# Patient Record
Sex: Female | Born: 1990 | Race: White | Hispanic: No | Marital: Married | Smoking: Never smoker
Health system: Southern US, Community
[De-identification: ages and names within clinical notes are randomized; demographics above are authoritative.]

## PROBLEM LIST (undated history)

## (undated) DIAGNOSIS — T7840XA Allergy, unspecified, initial encounter: Secondary | ICD-10-CM

## (undated) DIAGNOSIS — F419 Anxiety disorder, unspecified: Secondary | ICD-10-CM

## (undated) DIAGNOSIS — Z789 Other specified health status: Secondary | ICD-10-CM

## (undated) DIAGNOSIS — F32A Depression, unspecified: Secondary | ICD-10-CM

## (undated) DIAGNOSIS — J45909 Unspecified asthma, uncomplicated: Secondary | ICD-10-CM

## (undated) DIAGNOSIS — K219 Gastro-esophageal reflux disease without esophagitis: Secondary | ICD-10-CM

## (undated) HISTORY — DX: Other specified health status: Z78.9

## (undated) HISTORY — PX: NO PAST SURGERIES: SHX2092

## (undated) HISTORY — DX: Unspecified asthma, uncomplicated: J45.909

## (undated) HISTORY — DX: Depression, unspecified: F32.A

## (undated) HISTORY — DX: Allergy, unspecified, initial encounter: T78.40XA

## (undated) HISTORY — DX: Anxiety disorder, unspecified: F41.9

## (undated) HISTORY — DX: Gastro-esophageal reflux disease without esophagitis: K21.9

---
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
## 2016-05-20 ENCOUNTER — Encounter: Payer: Self-pay | Admitting: Emergency Medicine

## 2016-05-20 ENCOUNTER — Emergency Department
Admission: EM | Admit: 2016-05-20 | Discharge: 2016-05-20 | Disposition: A | Discharge status: Home / Self Care | Payer: Self-pay | Attending: Emergency Medicine | Admitting: Emergency Medicine

## 2016-05-20 DIAGNOSIS — G5602 Carpal tunnel syndrome, left upper limb: Secondary | ICD-10-CM | POA: Insufficient documentation

## 2016-05-20 MED ORDER — MELOXICAM 15 MG PO TABS: 15.0000 mg | ORAL_TABLET | Freq: Every day | ORAL | 0 refills | Status: DC

## 2016-05-20 NOTE — ED Notes (Signed)
See triage note. States she was carrying in bags about 5 days ago  Noticed some numbness to left middle finger  Numbness has cont'd for the past 5 days.no deformity noted

## 2016-05-20 NOTE — ED Provider Notes (Signed)
[facility] Emergency Department Provider Note  ____________________________________________  Time seen: Approximately 3:21 PM  I have reviewed the triage vital signs and the nursing notes.   HISTORY  Chief Complaint Numbness    HPI Jaime Harvey is a 26 y.o. female who presents emergency department complaining of pain and numbness and tingling to the third digit of the left hand. Patient reports that over the past several months she has noticed some intermittent symptoms to the left hand. 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 She reports that 5 days ago pain and numbness became constant. She states that it is localized right middle digit. She denies any pain or numbness or tingling to any other digit. She has not tried any medications for this complaint. No other complaints at this time.   History reviewed. No pertinent past medical history.  There are no active problems to display for this patient.   History reviewed. No pertinent surgical history.  Prior to Admission medications   Medication Sig Start Date End Date Taking? Authorizing Provider  meloxicam (MOBIC) 15 MG tablet Take 1 tablet (15 mg total) by mouth daily. 05/20/16   Delorise RoyalsJonathan D November Sypher, PA-C    Allergies Patient has no known allergies.  History reviewed. No pertinent family history.  Social History Social History  Substance Use Topics  . Smoking status: Never Smoker  . Smokeless tobacco: Never Used  . Alcohol use No     Review of Systems  Constitutional: No fever/chills Cardiovascular: no chest pain. Respiratory: no cough. No SOB. Gastrointestinal: No abdominal pain.  No nausea, no vomiting.   Musculoskeletal: Positive for pain, numbness and tingling to the third digit left hand Skin: Negative for rash, abrasions, lacerations, ecchymosis. Neurological: Negative for headaches, focal weakness or numbness. 10-point ROS  otherwise negative.  ____________________________________________   PHYSICAL EXAM:  VITAL SIGNS: ED Triage Vitals [05/20/16 1504]  Enc Vitals Group     BP (!) 143/79     Pulse Rate 72     Resp 16     Temp 98.2 F (36.8 C)     Temp Source Oral     SpO2 100 %     Weight      Height      Head Circumference      Peak Flow      Pain Score 0     Pain Loc      Pain Edu?      Excl. in GC?      Constitutional: Alert and oriented. Well appearing and in no acute distress. Eyes: Conjunctivae are normal. PERRL. EOMI. Head: Atraumatic. Neck: No stridor.    Cardiovascular: Normal rate, regular rhythm. Normal S1 and S2.  Good peripheral circulation. Respiratory: Normal respiratory effort without tachypnea or retractions. Lungs CTAB. Good air entry to the bases with no decreased or absent breath sounds. Musculoskeletal: Full range of motion to all extremities. No gross deformities appreciated. No gross deformities or edema noted to left hand. Full range of motion to the left wrist, all digits left hand. Cap refill less than 2 seconds all digits. Sensation intact all 5 digits, however, slightly different sensation to the third digit of the left hand. Negative Phalen's, positive Tinel's. Examination of the elbow, shoulder, cervical spine are unremarkable. Neurologic:  Normal speech and language. No gross focal neurologic deficits are appreciated.  Skin:  Skin is warm, dry and intact. No rash noted. Psychiatric: Mood and affect  are normal. Speech and behavior are normal. Patient exhibits appropriate insight and judgement.   ____________________________________________   LABS (all labs ordered are listed, but only abnormal results are displayed)  Labs Reviewed - No data to display ____________________________________________  EKG   ____________________________________________  RADIOLOGY   No results  found.  ____________________________________________    PROCEDURES  Procedure(s) performed:    Procedures    Medications - No data to display   ____________________________________________   INITIAL IMPRESSION / ASSESSMENT AND PLAN / ED COURSE  Pertinent labs & imaging results that were available during my care of the patient were reviewed by me and considered in my medical decision making (see chart for details).  Review of the [place] CSRS was performed in accordance of the [facility] prior to dispensing any controlled drugs.     Patient's diagnosis is consistent with carpal tunnel syndrome to the left hand. Patient's symptoms of intermittent numbness and tingling, weakness to the left hand over the past several months with now positive Tinel's and numbness and tingling to the third digit is consistent with carpal tunnel syndrome.. Patient will be discharged home with prescriptions for anti-inflammatory. She is also given a Velcro wrist brace to be used at nighttime and when not using left hand.. Patient is to follow up with orthopedics as needed or otherwise directed. Patient is given ED precautions to return to the ED for any worsening or new symptoms.     ____________________________________________  FINAL CLINICAL IMPRESSION(S) / ED DIAGNOSES  Final diagnoses:  Carpal tunnel syndrome of left wrist      NEW MEDICATIONS STARTED DURING THIS VISIT:  New Prescriptions   MELOXICAM (MOBIC) 15 MG TABLET    Take 1 tablet (15 mg total) by mouth daily.        This chart was dictated using voice recognition software/Dragon. Despite best efforts to proofread, errors can occur which can change the meaning. Any change was purely unintentional.    Racheal Patches, PA-C 05/20/16 1536    Jeanmarie Plant, MD 05/20/16 2258

## 2016-05-20 NOTE — ED Triage Notes (Signed)
Pt to ed with c/o numbness in left hand middle finger x 5 days after carrying heavy bags for several minutes.

## 2017-01-02 ENCOUNTER — Encounter: Payer: Self-pay | Admitting: Obstetrics and Gynecology

## 2017-05-06 ENCOUNTER — Emergency Department
Admission: EM | Admit: 2017-05-06 | Discharge: 2017-05-06 | Disposition: A | Discharge status: Home / Self Care | Payer: Worker's Compensation | Attending: Emergency Medicine | Admitting: Emergency Medicine

## 2017-05-06 ENCOUNTER — Emergency Department: Payer: Worker's Compensation

## 2017-05-06 ENCOUNTER — Encounter: Payer: Self-pay | Admitting: Emergency Medicine

## 2017-05-06 ENCOUNTER — Other Ambulatory Visit: Payer: Self-pay

## 2017-05-06 ENCOUNTER — Emergency Department
Admission: EM | Admit: 2017-05-06 | Discharge: 2017-05-07 | Disposition: A | Discharge status: Home / Self Care | Payer: Self-pay | Attending: Emergency Medicine | Admitting: Emergency Medicine

## 2017-05-06 DIAGNOSIS — T50905A Adverse effect of unspecified drugs, medicaments and biological substances, initial encounter: Secondary | ICD-10-CM

## 2017-05-06 DIAGNOSIS — S61411A Laceration without foreign body of right hand, initial encounter: Secondary | ICD-10-CM | POA: Diagnosis present

## 2017-05-06 DIAGNOSIS — Y9389 Activity, other specified: Secondary | ICD-10-CM | POA: Diagnosis not present

## 2017-05-06 DIAGNOSIS — R110 Nausea: Secondary | ICD-10-CM | POA: Insufficient documentation

## 2017-05-06 DIAGNOSIS — Y999 Unspecified external cause status: Secondary | ICD-10-CM | POA: Diagnosis not present

## 2017-05-06 DIAGNOSIS — Y9259 Other trade areas as the place of occurrence of the external cause: Secondary | ICD-10-CM | POA: Diagnosis not present

## 2017-05-06 DIAGNOSIS — W230XXA Caught, crushed, jammed, or pinched between moving objects, initial encounter: Secondary | ICD-10-CM | POA: Diagnosis not present

## 2017-05-06 DIAGNOSIS — T404X5A Adverse effect of other synthetic narcotics, initial encounter: Secondary | ICD-10-CM | POA: Insufficient documentation

## 2017-05-06 MED ORDER — IBUPROFEN 600 MG PO TABS
600.0000 mg | ORAL_TABLET | Freq: Once | ORAL | Status: AC
  Administered 2017-05-06: 600 mg via ORAL
  Filled 2017-05-06: qty 1

## 2017-05-06 MED ORDER — IBUPROFEN 800 MG PO TABS: 800.0000 mg | ORAL_TABLET | Freq: Three times a day (TID) | ORAL | 0 refills | Status: DC | PRN

## 2017-05-06 MED ORDER — TRAMADOL HCL 50 MG PO TABS
50.0000 mg | ORAL_TABLET | Freq: Once | ORAL | Status: AC
  Administered 2017-05-06: 50 mg via ORAL
  Filled 2017-05-06: qty 1

## 2017-05-06 MED ORDER — TRAMADOL HCL 50 MG PO TABS: 50.0000 mg | ORAL_TABLET | Freq: Four times a day (QID) | ORAL | 0 refills | Status: DC | PRN

## 2017-05-06 NOTE — ED Triage Notes (Addendum)
Patient ambulatory to triage with steady gait, without difficulty or distress noted; pt reports seen earlier today for wc injury; ; rx 800mg  ibuprofen and tramadol (took at 1pm and 6pm); st she had 2 panic attacks and nausea and is concerned she was having a reaction to the medication; pt denies any c/o at present; pt with rambling speech

## 2017-05-06 NOTE — ED Triage Notes (Signed)
First nurse note: Patient to ER from home via [facility] for c/o dizziness and weakness. Was seen here earlier today for lac to hand, was prescribed Tramadol. Patient states she took first dose of Tramadol tonight. Then developed dizziness and weakness.

## 2017-05-06 NOTE — ED Triage Notes (Signed)
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 Pt is filing workers comp.

## 2017-05-06 NOTE — ED Notes (Signed)
Patient employer profile pulled and printed placed in chart. Per employer profile no UDS or Breath Analysis required.

## 2017-05-06 NOTE — ED Notes (Signed)
Pt verbalized understanding of discharge instructions. NAD at this time. 

## 2017-05-06 NOTE — Discharge Instructions (Signed)
Follow-up with company workers ' compensation provider in 3-5 days.

## 2017-05-06 NOTE — ED Provider Notes (Signed)
[facility] Emergency Department Provider Note   ____________________________________________   First MD Initiated Contact with Patient 05/06/17 1158     (approximate)  I have reviewed the triage vital signs and the nursing notes.   HISTORY  Chief Complaint Laceration    HPI Jaime HoltsStephanie J Cherubin is a 27 y.o. female presents with a laceration to the dorsum aspect the right hand.  Patient is a hand was caught in the mix at work.  Bleeding controlled with direct pressure.  Patient denies loss of function but state feel added if the hand is going "numb".   History reviewed. No pertinent past medical history.  There are no active problems to display for this patient.   History reviewed. No pertinent surgical history.  Prior to Admission medications   Medication Sig Start Date End Date Taking? Authorizing Provider  ibuprofen (ADVIL,MOTRIN) 800 MG tablet Take 1 tablet (800 mg total) by mouth every 8 (eight) hours as needed for moderate pain. 05/06/17   Joni ReiningSmith, Jamella Grayer K, PA-C  meloxicam (MOBIC) 15 MG tablet Take 1 tablet (15 mg total) by mouth daily. 05/20/16   Cuthriell, Delorise RoyalsJonathan D, PA-C  traMADol (ULTRAM) 50 MG tablet Take 1 tablet (50 mg total) by mouth every 6 (six) hours as needed. 05/06/17 05/06/18  Joni ReiningSmith, Verity Gilcrest K, PA-C    Allergies Patient has no known allergies.  No family history on file.  Social History Social History   Tobacco Use  . Smoking status: Never Smoker  . Smokeless tobacco: Never Used  Substance Use Topics  . Alcohol use: No  . Drug use: No    Review of Systems Constitutional: No fever/chills Eyes: No visual changes. ENT: No sore throat. Cardiovascular: Denies chest pain. Respiratory: Denies shortness of breath. Gastrointestinal: No abdominal pain.  No nausea, no vomiting.  No diarrhea.  No constipation. Genitourinary: Negative for dysuria. Musculoskeletal: Negative for back pain. Skin: Negative for rash.  Laceration dorsal  aspect of right hand. Neurological: Negative for headaches, focal weakness or numbness.   ____________________________________________   PHYSICAL EXAM:  VITAL SIGNS: ED Triage Vitals [05/06/17 1105]  Enc Vitals Group     BP      Pulse      Resp      Temp      Temp src      SpO2      Weight 165 lb (74.8 kg)     Height 5\' 1"  (1.549 m)     Head Circumference      Peak Flow      Pain Score 4     Pain Loc      Pain Edu?      Excl. in GC?    Constitutional: Alert and oriented. Well appearing and in no acute distress. Cardiovascular: Normal rate, regular rhythm. Grossly normal heart sounds.  Good peripheral circulation. Respiratory: Normal respiratory effort.  No retractions. Lungs CTAB. Skin:  Skin is warm, dry and intact. No rash noted.  0.5 cm laceration dorsal aspect the right hand. Psychiatric: Mood and affect are normal. Speech and behavior are normal.  ____________________________________________   LABS (all labs ordered are listed, but only abnormal results are displayed)  Labs Reviewed - No data to display ____________________________________________  EKG   ____________________________________________  RADIOLOGY  ED MD interpretation: No acute findings x-ray of the right hand.  Official radiology report(s): Dg Hand 2 View Right  Result Date: 05/06/2017 CLINICAL DATA:  Right hand laceration due to an injury suffered when the patient's  hand became caught in a mixer today. Initial encounter. EXAM: RIGHT HAND - 2 VIEW COMPARISON:  None. FINDINGS: There is no evidence of fracture or dislocation. There is no evidence of arthropathy or other focal bone abnormality. Soft tissues are unremarkable. IMPRESSION: Negative exam. Electronically Signed   By: Drusilla Kanner M.D.   On: 05/06/2017 12:21    ____________________________________________   PROCEDURES  Procedure(s) performed: None  .Marland KitchenLaceration Repair Date/Time: 05/06/2017 1:00 PM Performed by: Joni Reining, PA-C Authorized by: Joni Reining, PA-C   Consent:    Consent obtained:  Verbal   Consent given by:  Patient   Risks discussed:  Infection and pain Anesthesia (see MAR for exact dosages):    Anesthesia method:  None Laceration details:    Location:  Hand   Hand location:  R hand, dorsum   Length (cm):  0.5 Repair type:    Repair type:  Simple Pre-procedure details:    Preparation:  Patient was prepped and draped in usual sterile fashion Exploration:    Contaminated: no   Treatment:    Area cleansed with:  Betadine and saline   Amount of cleaning:  Standard   Irrigation solution:  Sterile saline   Irrigation method:  Syringe   Visualized foreign bodies/material removed: no   Skin repair:    Repair method:  Tissue adhesive Approximation:    Approximation:  Close Post-procedure details:    Dressing:  Sterile dressing   Patient tolerance of procedure:  Tolerated well, no immediate complications    Critical Care performed: No  ____________________________________________   INITIAL IMPRESSION / ASSESSMENT AND PLAN / ED COURSE  As part of my medical decision making, I reviewed the following data within the electronic MEDICAL RECORD NUMBER    Hand pain secondary to laceration and contusion.  Discussed negative x-ray findings with patient.  Laceration was closed with Dermabond.  Patient given discharge care instruction and limitations return to work.  Patient advised to follow-up with company Worker's Compensation provider.      ____________________________________________   FINAL CLINICAL IMPRESSION(S) / ED DIAGNOSES  Final diagnoses:  Laceration of right hand without foreign body, initial encounter     ED Discharge Orders        Ordered    traMADol (ULTRAM) 50 MG tablet  Every 6 hours PRN     05/06/17 1256    ibuprofen (ADVIL,MOTRIN) 800 MG tablet  Every 8 hours PRN     05/06/17 1256       Note:  This document was prepared using Dragon voice recognition  software and may include unintentional dictation errors.    Joni Reining, PA-C 05/06/17 1305    Emily Filbert, MD 05/06/17 1320

## 2017-05-07 NOTE — ED Provider Notes (Signed)
[facility] Emergency Department Provider Note    First MD Initiated Contact with Patient 05/06/17 2339     (approximate)  I have reviewed the triage vital signs and the nursing notes.   HISTORY  Chief Complaint Allergic Reaction    HPI Jaime Harvey is a 27 y.o. femaleReturns to the emergency department with feelings of anxiety and nausea after taking tramadol which she was prescribed today secondary to a laceration to the dorsal aspect of her hand which occurred at work.Patient states that she took tramadol at 6 PM and within approximately 40 minutes after taking the pill started to experience the symptoms.  Patient states that she also had dizziness and generalized weakness.  In addition patient states that she was advised to return to work tomorrow however she states that she is incapable of returning to work tomorrow secondary to discomfort in her right hand   Past medical history None There are no active problems to display for this patient.   History reviewed. No pertinent surgical history.  Prior to Admission medications   Medication Sig Start Date End Date Taking? Authorizing Provider  ibuprofen (ADVIL,MOTRIN) 800 MG tablet Take 1 tablet (800 mg total) by mouth every 8 (eight) hours as needed for moderate pain. 05/06/17   Joni ReiningSmith, Ronald K, PA-C  meloxicam (MOBIC) 15 MG tablet Take 1 tablet (15 mg total) by mouth daily. 05/20/16   Cuthriell, Delorise RoyalsJonathan D, PA-C  traMADol (ULTRAM) 50 MG tablet Take 1 tablet (50 mg total) by mouth every 6 (six) hours as needed. 05/06/17 05/06/18  Joni ReiningSmith, Ronald K, PA-C    Allergies No known Drug Allergies History reviewed. No pertinent family history.  Social History Social History   Tobacco Use  . Smoking status: Never Smoker  . Smokeless tobacco: Never Used  Substance Use Topics  . Alcohol use: No  . Drug use: No    Review of Systems Constitutional: No fever/chills Eyes: No visual changes. ENT: No sore  throat. Cardiovascular: Denies chest pain. Respiratory: Denies shortness of breath. Gastrointestinal: No abdominal pain.  No nausea, no vomiting.  No diarrhea.  No constipation. Genitourinary: Negative for dysuria. Musculoskeletal: Negative for neck pain.  Negative for back pain. Integumentary: Negative for rash. Neurological: Negative for headaches, focal weakness or numbness.   ____________________________________________   PHYSICAL EXAM:  VITAL SIGNS: ED Triage Vitals  Enc Vitals Group     BP 05/06/17 2259 123/75     Pulse Rate 05/06/17 2259 85     Resp 05/06/17 2259 18     Temp --      Temp Source 05/06/17 2259 Oral     SpO2 05/06/17 2259 100 %     Weight 05/06/17 2301 74.8 kg (165 lb)     Height 05/06/17 2301 1.549 m (5\' 1" )     Head Circumference --      Peak Flow --      Pain Score --      Pain Loc --      Pain Edu? --      Excl. in GC? --     Constitutional: Alert and oriented. Well appearing and in no acute distress. Eyes: Conjunctivae are normal.  Head: Atraumatic. Mouth/Throat: Mucous membranes are moist.  Oropharynx non-erythematous. Neck: No stridor.   Cardiovascular: Normal rate, regular rhythm. Good peripheral circulation. Grossly normal heart sounds. Respiratory: Normal respiratory effort.  No retractions. Lungs CTAB. Gastrointestinal: Soft and nontender. No distention.  Musculoskeletal: No lower extremity tenderness nor edema. No  gross deformities of extremities. Neurologic:  Normal speech and language. No gross focal neurologic deficits are appreciated.  Skin:  Skin is warm, dry and intact. No rash noted. Psychiatric: Mood and affect are normal. Speech and behavior are normal.  ___________  RADIOLOGY I,  [place] Kemani Heidel, personally viewed and evaluated these images (plain radiographs) as part of my medical decision making, as well as reviewing the written report by the radiologist.  ED MD interpretation: No acute fracture noted  Official  radiology report(s): Dg Hand 2 View Right  Result Date: 05/06/2017 CLINICAL DATA:  Right hand laceration due to an injury suffered when the patient's hand became caught in a mixer today. Initial encounter. EXAM: RIGHT HAND - 2 VIEW COMPARISON:  None. FINDINGS: There is no evidence of fracture or dislocation. There is no evidence of arthropathy or other focal bone abnormality. Soft tissues are unremarkable. IMPRESSION: Negative exam. Electronically Signed   By: Drusilla Kanner M.D.   On: 05/06/2017 12:21    ____________________________________________   PROCEDURES   Procedures   ____________________________________________   INITIAL IMPRESSION / ASSESSMENT AND PLAN / ED COURSE  As part of my medical decision making, I reviewed the following data within the electronic MEDICAL RECORD NUMBER   27 year old female presented emergency department above-stated history and physical exam secondary to tramadol side effect.  In addition patient is requesting an work note for tomorrow think that she would be incapable of returning to work tomorrow [place] to pain discomfort from injury she sustained day at work.     ____________________________________________  FINAL CLINICAL IMPRESSION(S) / ED DIAGNOSES  Final diagnoses:  Medication side effect, initial encounter     MEDICATIONS GIVEN DURING THIS VISIT:  Medications - No data to display   ED Discharge Orders    None       Note:  This document was prepared using Dragon voice recognition software and may include unintentional dictation errors.   Darci Current, MD 05/07/17 [phone]

## 2017-05-08 ENCOUNTER — Other Ambulatory Visit: Payer: Self-pay

## 2017-05-08 ENCOUNTER — Emergency Department
Admission: EM | Admit: 2017-05-08 | Discharge: 2017-05-08 | Disposition: A | Discharge status: Home / Self Care | Payer: Worker's Compensation | Attending: Student in an Organized Health Care Education/Training Program | Admitting: Student in an Organized Health Care Education/Training Program

## 2017-05-08 DIAGNOSIS — S61401D Unspecified open wound of right hand, subsequent encounter: Secondary | ICD-10-CM | POA: Insufficient documentation

## 2017-05-08 DIAGNOSIS — Y33XXXD Other specified events, undetermined intent, subsequent encounter: Secondary | ICD-10-CM | POA: Insufficient documentation

## 2017-05-08 DIAGNOSIS — Z5189 Encounter for other specified aftercare: Secondary | ICD-10-CM

## 2017-05-08 MED ORDER — BACITRACIN ZINC 500 UNIT/GM EX OINT
TOPICAL_OINTMENT | Freq: Once | CUTANEOUS | Status: AC
  Administered 2017-05-08: 1 via TOPICAL
  Filled 2017-05-08: qty 0.9

## 2017-05-08 NOTE — ED Notes (Addendum)
Pt reports pain from lac (visit 2 days ago) worse tonight but has since resolved and feels better, reports intolerance to tramadol - added to allergy list-   Wound appears unremarkable and healing by primary intention, dressing applied

## 2017-05-08 NOTE — ED Provider Notes (Signed)
[facility] Emergency Department Provider Note    None    (approximate)  I have reviewed the triage vital signs and the nursing notes.   HISTORY  Chief Complaint Wound Check    HPI Jaime Harvey is a 27 y.o. female presents for wound check.  Had wound dermabonded earlier this week.  States she was taking a shower this evening and had burning at the wound.  No fevers.  States the dismcomfort is mild   No past medical history on file. No family history on file. No past surgical history on file. There are no active problems to display for this patient.     Prior to Admission medications   Medication Sig Start Date End Date Taking? Authorizing Provider  ibuprofen (ADVIL,MOTRIN) 800 MG tablet Take 1 tablet (800 mg total) by mouth every 8 (eight) hours as needed for moderate pain. 05/06/17   Joni Reining, PA-C  meloxicam (MOBIC) 15 MG tablet Take 1 tablet (15 mg total) by mouth daily. 05/20/16   Cuthriell, Delorise Royals, PA-C  traMADol (ULTRAM) 50 MG tablet Take 1 tablet (50 mg total) by mouth every 6 (six) hours as needed. 05/06/17 05/06/18  Joni Reining, PA-C    Allergies Patient has no known allergies.    Social History Social History   Tobacco Use  . Smoking status: Never Smoker  . Smokeless tobacco: Never Used  Substance Use Topics  . Alcohol use: No  . Drug use: No    Review of Systems Patient denies headaches, rhinorrhea, blurry vision, numbness, shortness of breath, chest pain, edema, cough, abdominal pain, nausea, vomiting, diarrhea, dysuria, fevers, rashes or hallucinations unless otherwise stated above in HPI. ____________________________________________   PHYSICAL EXAM:  VITAL SIGNS: Vitals:   05/08/17 2107  BP: 132/72  Pulse: 86  Resp: 16  Temp: 98.7 F (37.1 C)  SpO2: 100%    Constitutional: Alert and oriented. Well appearing and in no acute distress. Eyes: Conjunctivae are normal.  Head: Atraumatic. Nose:  No congestion/rhinnorhea. Mouth/Throat: Mucous membranes are moist.   Neck: Painless ROM.  Cardiovascular:   Good peripheral circulation. Respiratory: Normal respiratory effort.  No retractions.  Gastrointestinal: Soft and nontender.  Musculoskeletal: No lower extremity tenderness .  No joint effusions. Neurologic:  Normal speech and language. No gross focal neurologic deficits are appreciated.  Skin:  Skin is warm, dry and intact. Small wound to right hand with no purulent drainage, small area or erythema Psychiatric: Mood and affect are normal. Speech and behavior are normal.  ____________________________________________   LABS (all labs ordered are listed, but only abnormal results are displayed)  No results found for this or any previous visit (from the past 24 hour(s)). ____________________________________________ ____________________________________________   PROCEDURES  Procedure(s) performed:  Procedures    Critical Care performed: no ____________________________________________   INITIAL IMPRESSION / ASSESSMENT AND PLAN / ED COURSE  Pertinent labs & imaging results that were available during my care of the patient were reviewed by me and considered in my medical decision making (see chart for details).  DDX: wound check, cellulitis, abscess  Jaime Harvey is a 27 y.o. who presents to the ED with wound.  Small amount of irritation.  Will recommend topical abx.        ____________________________________________   FINAL CLINICAL IMPRESSION(S) / ED DIAGNOSES  Final diagnoses:  Visit for wound check      NEW MEDICATIONS STARTED DURING THIS VISIT:  New Prescriptions   No medications on file  Note:  This document was prepared using Dragon voice recognition software and may include unintentional dictation errors.     Willy Eddyobinson, Clariza Sickman, MD 05/08/17 [phone]

## 2017-05-08 NOTE — ED Triage Notes (Signed)
Pt reports she was seen here on Wednesday for a cut to her right hand. Area was glued. Today pt reports she got the area wet and now she is having burning to the area.

## 2017-07-01 ENCOUNTER — Encounter: Payer: Self-pay | Admitting: Obstetrics and Gynecology

## 2017-07-13 ENCOUNTER — Encounter: Payer: Self-pay | Admitting: Obstetrics and Gynecology

## 2017-07-23 ENCOUNTER — Ambulatory Visit (INDEPENDENT_AMBULATORY_CARE_PROVIDER_SITE_OTHER): Payer: Self-pay | Admitting: Obstetrics and Gynecology

## 2017-07-23 ENCOUNTER — Encounter: Payer: Self-pay | Admitting: Obstetrics and Gynecology

## 2017-07-23 VITALS — BP 112/72 | HR 72 | Wt 200.0 lb

## 2017-07-23 DIAGNOSIS — Z3A01 Less than 8 weeks gestation of pregnancy: Secondary | ICD-10-CM

## 2017-07-23 DIAGNOSIS — Z3200 Encounter for pregnancy test, result unknown: Secondary | ICD-10-CM

## 2017-07-23 DIAGNOSIS — N926 Irregular menstruation, unspecified: Secondary | ICD-10-CM

## 2017-07-23 DIAGNOSIS — Z3169 Encounter for other general counseling and advice on procreation: Secondary | ICD-10-CM

## 2017-07-23 NOTE — Progress Notes (Signed)
Patient ID: Jaime Harvey, female   DOB: 07-Aug-1990, 27 y.o.   MRN: [id number]  Reason for Consult: Infertility   Referred by No ref. provider found  Subjective:     HPI:  Jaime Harvey is a 27 y.o. female . She has been trying to concieve for 11 months. She has been taking home pregnancy tests and home ovulation tests. She says that her ovulation tes ts have only been positive once over the last 11 months.  She reports that on 07/15/17 she had a positive home pregnancy test. Then on 07/17/17- 07/19/17 she had vaginal bleeding. She would like to know if she was or is pregnant.   She is present today in office with her partner. He does not have children with any there partners.   She reports that she reached menarche at 16. When she was younger she had regular monthly periods that lasted 4 days with moderate bleeding.  She then used Depo Provera for 5 years between 2012 and 2017. She has been off of Depo for two years but her periods have not become regular.   She reports that  She has had light brown spotting on these days for the last several months: 06/20/17-06/22/17 05/23/17-05/25/17 04/21/17- 04/23/17  She reports that it has been 1-2 years since her last pap smear and that all of her pap smears in the past were normal.  She has no history of sexually transmitted infections.  She is a nonsmoker. She has had not prior pregnancies.   Past Medical History:  Diagnosis Date  . No known health problems    Family History  Problem Relation Age of Onset  . Diabetes Maternal Grandmother    Past Surgical History:  Procedure Laterality Date  . NO PAST SURGERIES      Short Social History:  Social History   Tobacco Use  . Smoking status: Never Smoker  . Smokeless tobacco: Never Used  Substance Use Topics  . Alcohol use: No    Allergies  Allergen Reactions  . Tramadol Other (See Comments)    "Panic attack and anxiety"    Current Outpatient Medications  Medication  Sig Dispense Refill  . Nutritional Supplements (CONCEPTIONXR REPRODUCTIVE) TABS Take by mouth.    . Prenatal Vit-Fe Fumarate-FA (MULTIVITAMIN-PRENATAL) 27-0.8 MG TABS tablet Take 1 tablet by mouth daily at 12 noon.     No current facility-administered medications for this visit.     Review of Systems  Constitutional: Negative for chills, fatigue, fever and unexpected weight change.  HENT: Negative for trouble swallowing.  Eyes: Negative for loss of vision.  Respiratory: Negative for cough, shortness of breath and wheezing.  Cardiovascular: Negative for chest pain, leg swelling, palpitations and syncope.  GI: Negative for abdominal pain, blood in stool, diarrhea, nausea and vomiting.  GU: Positive for frequency. Negative for difficulty urinating, dysuria and hematuria.  Musculoskeletal: Negative for back pain, leg pain and joint pain.  Skin: Negative for rash.  Neurological: Negative for dizziness, headaches, light-headedness, numbness and seizures.  Psychiatric: Negative for behavioral problem, confusion, depressed mood and sleep disturbance.        Objective:  Objective   Vitals:   07/23/17 1525  BP: 112/72  Pulse: 72  Weight: 200 lb (90.7 kg)   Body mass index is 37.79 kg/m.  Physical Exam  Constitutional: She is oriented to person, place, and time. She appears well-developed and well-nourished.  HENT:  Head: Normocephalic and atraumatic.  Eyes: EOM are normal.  Cardiovascular:  Normal rate, regular rhythm and normal heart sounds.  Pulmonary/Chest: Effort normal and breath sounds normal.  Neurological: She is alert and oriented to person, place, and time.  Skin: Skin is warm and dry.  Psychiatric: She has a normal mood and affect. Her behavior is normal. Judgment and thought content normal.  Nursing note and vitals reviewed.   Assessment/Plan:     27 yo G0P0 here for infertility/ questionable pregnancy. Patient would like to know if she was pregnant. She did have a  positive home pregnancy test on 07/15/17. She does not want to do a full infertility evaluation at this time because she is paying out of pocket for the visit.   Will follow up as needed.   More than 45 minutes were spent face to face with the patient in the room with more than 50% of the time spent providing counseling and discussing the plan of management. We discussed infertility, normal work up for infertility and plan of care for unknown pregnancy.    Adelene Idler MD [facility], [facility] 08/05/17 4:35 PM

## 2017-07-24 LAB — BETA HCG QUANT (REF LAB)

## 2017-07-24 NOTE — Progress Notes (Signed)
Called and discussed with patient

## 2017-08-05 ENCOUNTER — Encounter: Payer: Self-pay | Admitting: Obstetrics and Gynecology

## 2017-12-19 ENCOUNTER — Other Ambulatory Visit: Payer: Self-pay

## 2017-12-19 ENCOUNTER — Emergency Department: Payer: Medicaid Other

## 2017-12-19 ENCOUNTER — Encounter: Payer: Self-pay | Admitting: Emergency Medicine

## 2017-12-19 ENCOUNTER — Emergency Department
Admission: EM | Admit: 2017-12-19 | Discharge: 2017-12-19 | Disposition: A | Discharge status: Home / Self Care | Payer: Medicaid Other | Attending: Emergency Medicine | Admitting: Emergency Medicine

## 2017-12-19 DIAGNOSIS — S93401A Sprain of unspecified ligament of right ankle, initial encounter: Secondary | ICD-10-CM | POA: Insufficient documentation

## 2017-12-19 DIAGNOSIS — L300 Nummular dermatitis: Secondary | ICD-10-CM | POA: Insufficient documentation

## 2017-12-19 DIAGNOSIS — Y929 Unspecified place or not applicable: Secondary | ICD-10-CM | POA: Insufficient documentation

## 2017-12-19 DIAGNOSIS — Y999 Unspecified external cause status: Secondary | ICD-10-CM | POA: Insufficient documentation

## 2017-12-19 DIAGNOSIS — Z79899 Other long term (current) drug therapy: Secondary | ICD-10-CM | POA: Insufficient documentation

## 2017-12-19 DIAGNOSIS — X58XXXA Exposure to other specified factors, initial encounter: Secondary | ICD-10-CM | POA: Insufficient documentation

## 2017-12-19 DIAGNOSIS — Y939 Activity, unspecified: Secondary | ICD-10-CM | POA: Insufficient documentation

## 2017-12-19 MED ORDER — TRIAMCINOLONE ACETONIDE 0.025 % EX OINT: 1.0000 "application " | TOPICAL_OINTMENT | Freq: Two times a day (BID) | CUTANEOUS | 0 refills | Status: AC

## 2017-12-19 MED ORDER — MELOXICAM 15 MG PO TABS: 15.0000 mg | ORAL_TABLET | Freq: Every day | ORAL | 1 refills | Status: AC

## 2017-12-19 NOTE — ED Triage Notes (Signed)
Rash R arm x 1 year. R ankle pain x 2 weeks without injury.

## 2017-12-19 NOTE — ED Provider Notes (Signed)
[facility] Emergency Department Provider Note  ____________________________________________  Time seen: Approximately 4:50 PM  I have reviewed the triage vital signs and the nursing notes.   HISTORY  Chief Complaint Rash and Foot Pain    HPI Jaime Harvey is a 27 y.o. female presents to the ED with a rash along the volar aspect of the right arm that has been present for approximately the one year and persistent right ankle pain.  Rash appeared without provoking factors.  It is sometimes pruritic.  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  Rash is only localized to forearm and no other locations of her body.  No other sick contacts in the home with similar rash.  Patient has not tried any alleviating medications for rash.  For ankle sprain, patient rates her pain at 6 out of 10 in intensity and describes it as aching and worse with prolonged standing.  Patient had a recent vacation and patient reports that pain completely went away.  She denies falls or mechanisms of trauma did but did have an ankle sprain on right side in the past.  She occasionally gets some tingling in right foot.  No other alleviating measures of been attempted.   Past Medical History:  Diagnosis Date  . No known health problems     There are no active problems to display for this patient.   Past Surgical History:  Procedure Laterality Date  . NO PAST SURGERIES      Prior to Admission medications   Medication Sig Start Date End Date Taking? Authorizing Provider  meloxicam (MOBIC) 15 MG tablet Take 1 tablet (15 mg total) by mouth daily for 7 days. 12/19/17 12/26/17  Orvil Feil, PA-C  Nutritional Supplements (CONCEPTIONXR REPRODUCTIVE) TABS Take by mouth.    [provider]  Prenatal Vit-Fe Fumarate-FA (MULTIVITAMIN-PRENATAL) 27-0.8 MG TABS tablet Take 1 tablet by mouth daily at 12 noon.    [provider]  triamcinolone (KENALOG)  0.025 % ointment Apply 1 application topically 2 (two) times daily for 7 days. 12/19/17 12/26/17  Orvil Feil, PA-C    Allergies Tramadol  Family History  Problem Relation Age of Onset  . Diabetes Maternal Grandmother     Social History Social History   Tobacco Use  . Smoking status: Never Smoker  . Smokeless tobacco: Never Used  Substance Use Topics  . Alcohol use: No  . Drug use: No     Review of Systems  Constitutional: No fever/chills Eyes: No visual changes. No discharge ENT: No upper respiratory complaints. Cardiovascular: no chest pain. Respiratory: no cough. No SOB. Gastrointestinal: No abdominal pain.  No nausea, no vomiting.  No diarrhea.  No constipation. Musculoskeletal: Patient has right ankle pain.  Skin: Patient has rash of right forearm.  Neurological: Negative for headaches, focal weakness or numbness.   ____________________________________________   PHYSICAL EXAM:  VITAL SIGNS: ED Triage Vitals  Enc Vitals Group     BP 12/19/17 1436 115/73     Pulse Rate 12/19/17 1436 75     Resp 12/19/17 1436 18     Temp 12/19/17 1436 98.7 F (37.1 C)     Temp Source 12/19/17 1436 Oral     SpO2 12/19/17 1436 100 %     Weight 12/19/17 1438 180 lb (81.6 kg)     Height 12/19/17 1438 5\' 6"  (1.676 m)     Head Circumference --      Peak Flow --  Pain Score 12/19/17 1438 3     Pain Loc --      Pain Edu? --      Excl. in GC? --      Constitutional: Alert and oriented. Well appearing and in no acute distress. Eyes: Conjunctivae are normal. PERRL. EOMI. Head: Atraumatic. ENT:      Ears: TMs are pearly.      Nose: No congestion/rhinnorhea.      Mouth/Throat: Mucous membranes are moist.  Neck: No stridor.  No cervical spine tenderness to palpation. Hematological/Lymphatic/Immunilogical: No cervical lymphadenopathy. Cardiovascular: Normal rate, regular rhythm. Normal S1 and S2.  Good peripheral circulation. Respiratory: Normal respiratory effort  without tachypnea or retractions. Lungs CTAB. Good air entry to the bases with no decreased or absent breath sounds. Gastrointestinal: Bowel sounds 4 quadrants. Soft and nontender to palpation. No guarding or rigidity. No palpable masses. No distention. No CVA tenderness. Musculoskeletal: Right ankle: Patient has tenderness with palpation over anterior and posterior talofibular ligaments.  No pain over the deltoid ligament.  No pain when squeezing first through fifth metatarsals.  Palpable dorsalis pedis pulse bilaterally and symmetrically. Neurologic:  Normal speech and language. No gross focal neurologic deficits are appreciated.  Skin: Patient has erythematous, maculopapular, circumferential rash of right forearm. Psychiatric: Mood and affect are normal. Speech and behavior are normal. Patient exhibits appropriate insight and judgement.   ____________________________________________   LABS (all labs ordered are listed, but only abnormal results are displayed)  Labs Reviewed - No data to display ____________________________________________  EKG   ____________________________________________  RADIOLOGY I personally viewed and evaluated these images as part of my medical decision making, as well as reviewing the written report by the radiologist.  Dg Ankle Complete Right  Result Date: 12/19/2017 CLINICAL DATA:  Right ankle pain for 2 weeks without known injury. EXAM: RIGHT ANKLE - COMPLETE 3+ VIEW COMPARISON:  None. FINDINGS: There is no evidence of fracture, dislocation, or joint effusion. There is no evidence of arthropathy or other focal bone abnormality. Soft tissues are unremarkable. IMPRESSION: Negative. Electronically Signed   By: Lupita Raider, M.D.   On: 12/19/2017 15:39    ____________________________________________    PROCEDURES  Procedure(s) performed:    Procedures    Medications - No data to  display   ____________________________________________   INITIAL IMPRESSION / ASSESSMENT AND PLAN / ED COURSE  Pertinent labs & imaging results that were available during my care of the patient were reviewed by me and considered in my medical decision making (see chart for details).  Review of the [place] CSRS was performed in accordance of the [facility] prior to dispensing any controlled drugs.    Assessment and plan Right ankle pain Rash Patient presents to the emergency department with right lateral ankle pain and rash.  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  Patient underwent x-ray examination of the right ankle and no acute bony abnormality was identified.  Patient was discharged with meloxicam after patient assured me that there was no possibility of pregnancy.  Patient was advised to rest and elevate ankle in the evenings.  She was advised to follow-up with primary care as needed.  All patient questions were answered.     ____________________________________________  FINAL CLINICAL IMPRESSION(S) / ED DIAGNOSES  Final diagnoses:  Nummular eczema  Sprain of right ankle, unspecified ligament, initial encounter      NEW MEDICATIONS STARTED DURING THIS VISIT:  ED Discharge Orders  Ordered    triamcinolone (KENALOG) 0.025 % ointment  2 times daily     12/19/17 1644    meloxicam (MOBIC) 15 MG tablet  Daily     12/19/17 1644              This chart was dictated using voice recognition software/Dragon. Despite best efforts to proofread, errors can occur which can change the meaning. Any change was purely unintentional.    Orvil Feil, PA-C 12/19/17 1657    Emily Filbert, MD 12/20/17 1324

## 2017-12-19 NOTE — ED Notes (Signed)
No recent injury  - pt  Reports  Pain r foot  On top for about 1 month   Pt is on her feet a lot at work pt reports pain is weight bearing

## 2018-03-04 ENCOUNTER — Encounter: Payer: Self-pay | Admitting: Emergency Medicine

## 2018-03-04 ENCOUNTER — Other Ambulatory Visit: Payer: Self-pay

## 2018-03-04 ENCOUNTER — Emergency Department
Admission: EM | Admit: 2018-03-04 | Discharge: 2018-03-04 | Disposition: A | Discharge status: Home / Self Care | Payer: Medicaid Other | Attending: Emergency Medicine | Admitting: Emergency Medicine

## 2018-03-04 ENCOUNTER — Emergency Department: Payer: Medicaid Other

## 2018-03-04 DIAGNOSIS — N83202 Unspecified ovarian cyst, left side: Secondary | ICD-10-CM | POA: Insufficient documentation

## 2018-03-04 DIAGNOSIS — N730 Acute parametritis and pelvic cellulitis: Secondary | ICD-10-CM | POA: Insufficient documentation

## 2018-03-04 DIAGNOSIS — R102 Pelvic and perineal pain: Secondary | ICD-10-CM | POA: Insufficient documentation

## 2018-03-04 DIAGNOSIS — Z79899 Other long term (current) drug therapy: Secondary | ICD-10-CM | POA: Insufficient documentation

## 2018-03-04 DIAGNOSIS — N3000 Acute cystitis without hematuria: Secondary | ICD-10-CM

## 2018-03-04 LAB — CBC
HCT: 40.8 % (ref 36.0–46.0)
Hemoglobin: 14.1 g/dL (ref 12.0–15.0)
MCH: 30.5 pg (ref 26.0–34.0)
MCHC: 34.6 g/dL (ref 30.0–36.0)
MCV: 88.1 fL (ref 80.0–100.0)
NRBC: 0 % (ref 0.0–0.2)
PLATELETS: 303 10*3/uL (ref 150–400)
RBC: 4.63 MIL/uL (ref 3.87–5.11)
RDW: 12.3 % (ref 11.5–15.5)
WBC: 8.8 10*3/uL (ref 4.0–10.5)

## 2018-03-04 LAB — URINALYSIS, COMPLETE (UACMP) WITH MICROSCOPIC
BILIRUBIN URINE: NEGATIVE
Glucose, UA: NEGATIVE mg/dL
HGB URINE DIPSTICK: NEGATIVE
Ketones, ur: NEGATIVE mg/dL
LEUKOCYTES UA: NEGATIVE
Nitrite: NEGATIVE
Protein, ur: 30 mg/dL — AB
Specific Gravity, Urine: 1.03 (ref 1.005–1.030)
pH: 5 (ref 5.0–8.0)

## 2018-03-04 LAB — WET PREP, GENITAL
CLUE CELLS WET PREP: NONE SEEN
SPERM: NONE SEEN
Trich, Wet Prep: NONE SEEN
Yeast Wet Prep HPF POC: NONE SEEN

## 2018-03-04 LAB — COMPREHENSIVE METABOLIC PANEL
ALK PHOS: 80 U/L (ref 38–126)
ALT: 20 U/L (ref 0–44)
ANION GAP: 6 (ref 5–15)
AST: 20 U/L (ref 15–41)
Albumin: 4.1 g/dL (ref 3.5–5.0)
BILIRUBIN TOTAL: 0.5 mg/dL (ref 0.3–1.2)
BUN: 12 mg/dL (ref 6–20)
CALCIUM: 8.9 mg/dL (ref 8.9–10.3)
CO2: 28 mmol/L (ref 22–32)
CREATININE: 0.92 mg/dL (ref 0.44–1.00)
Chloride: 104 mmol/L (ref 98–111)
Glucose, Bld: 114 mg/dL — ABNORMAL HIGH (ref 70–99)
Potassium: 3.5 mmol/L (ref 3.5–5.1)
SODIUM: 138 mmol/L (ref 135–145)
TOTAL PROTEIN: 7.5 g/dL (ref 6.5–8.1)

## 2018-03-04 LAB — CHLAMYDIA/NGC RT PCR (ARMC ONLY)
Chlamydia Tr: NOT DETECTED
N gonorrhoeae: NOT DETECTED

## 2018-03-04 LAB — LIPASE, BLOOD: Lipase: 30 U/L (ref 11–51)

## 2018-03-04 LAB — POC URINE PREG, ED: PREG TEST UR: NEGATIVE

## 2018-03-04 MED ORDER — DOXYCYCLINE HYCLATE 100 MG PO CAPS: 100.0000 mg | ORAL_CAPSULE | Freq: Two times a day (BID) | ORAL | 0 refills | Status: AC

## 2018-03-04 MED ORDER — CEFTRIAXONE SODIUM 250 MG IJ SOLR
250.0000 mg | Freq: Once | INTRAMUSCULAR | Status: AC
  Administered 2018-03-04: 250 mg via INTRAMUSCULAR
  Filled 2018-03-04: qty 250

## 2018-03-04 MED ORDER — LIDOCAINE HCL (PF) 1 % IJ SOLN
INTRAMUSCULAR | Status: AC
  Filled 2018-03-04: qty 5

## 2018-03-04 NOTE — ED Triage Notes (Signed)
Pt here with c/o intermittent LLQ pain that started today, has had her period this month, appears in NAD.

## 2018-03-04 NOTE — ED Provider Notes (Signed)
[facility] Emergency Department Provider Note ____   First MD Initiated Contact with Patient 03/04/18 1850     (approximate)  I have reviewed the triage vital signs and the nursing notes.   HISTORY  Chief Complaint Abdominal Cramping    HPI Jaime Harvey is a 28 y.o. female presents to the emergency department with bilateral pelvic discomfort and dyspareunia which patient states has been occurring for "a long time".  Patient denies any nausea no vomiting.  Patient denies any vaginal discharge.  Patient denies any fever.  Patient admits to actively trying to become pregnant for over a year without any success.  Patient states that she has not seen OB/GYN in approximately 3 years.   Past Medical History:  Diagnosis Date  . No known health problems     There are no active problems to display for this patient.   Past Surgical History:  Procedure Laterality Date  . NO PAST SURGERIES      Prior to Admission medications   Medication Sig Start Date End Date Taking? Authorizing Provider  doxycycline (VIBRAMYCIN) 100 MG capsule Take 1 capsule (100 mg total) by mouth 2 (two) times daily for 14 days. 03/04/18 03/18/18  Darci Current, MD  Nutritional Supplements (CONCEPTIONXR REPRODUCTIVE) TABS Take by mouth.    [provider]  Prenatal Vit-Fe Fumarate-FA (MULTIVITAMIN-PRENATAL) 27-0.8 MG TABS tablet Take 1 tablet by mouth daily at 12 noon.    [provider]    Allergies Tramadol  Family History  Problem Relation Age of Onset  . Diabetes Maternal Grandmother     Social History Social History   Tobacco Use  . Smoking status: Never Smoker  . Smokeless tobacco: Never Used  Substance Use Topics  . Alcohol use: No  . Drug use: No    Review of Systems Constitutional: No fever/chills Eyes: No visual changes. ENT: No sore throat. Cardiovascular: Denies chest pain. Respiratory: Denies shortness of  breath. Gastrointestinal: Positive for pelvic pain no nausea, no vomiting.  No diarrhea.  No constipation. Genitourinary: Negative for dysuria. Musculoskeletal: Negative for neck pain.  Negative for back pain. Integumentary: Negative for rash. Neurological: Negative for headaches, focal weakness or numbness.  ____________________________________________   PHYSICAL EXAM:  VITAL SIGNS: ED Triage Vitals [03/04/18 1707]  Enc Vitals Group     BP      Pulse      Resp 18     Temp (!) 97.4 F (36.3 C)     Temp Source Oral     SpO2      Weight 81.6 kg (180 lb)     Height 1.549 m (5\' 1" )     Head Circumference      Peak Flow      Pain Score 5     Pain Loc      Pain Edu?      Excl. in GC?     Constitutional: Alert and oriented. Well appearing and in no acute distress. Eyes: Conjunctivae are normal. . Mouth/Throat: Mucous membranes are moist. Oropharynx non-erythematous. Neck: No stridor.  Cardiovascular: Normal rate, regular rhythm. Good peripheral circulation. Grossly normal heart sounds. Respiratory: Normal respiratory effort.  No retractions. Lungs CTAB. Gastrointestinal: Soft and nontender. No distention.  Genitourinary: No vaginal discharge noted.  Positive cervical motion tenderness Musculoskeletal: No lower extremity tenderness nor edema. No gross deformities of extremities. Neurologic:  Normal speech and language. No gross focal neurologic deficits are appreciated.  Skin:  Skin is warm, dry and  intact. No rash noted.   ____________________________________________   LABS (all labs ordered are listed, but only abnormal results are displayed)  Labs Reviewed  WET PREP, GENITAL - Abnormal; Notable for the following components:      Result Value   WBC, Wet Prep HPF POC FEW (*)    All other components within normal limits  COMPREHENSIVE METABOLIC PANEL - Abnormal; Notable for the following components:   Glucose, Bld 114 (*)    All other components within normal limits   URINALYSIS, COMPLETE (UACMP) WITH MICROSCOPIC - Abnormal; Notable for the following components:   Color, Urine YELLOW (*)    APPearance TURBID (*)    Protein, ur 30 (*)    Bacteria, UA MANY (*)    Squamous Epithelial / LPF >50 (*)    All other components within normal limits  CHLAMYDIA/NGC RT PCR ([facility] ONLY)  LIPASE, BLOOD  CBC  POC URINE PREG, ED   ___________________  RADIOLOGY I, Power N , personally viewed and evaluated these images (plain radiographs) as part of my medical decision making, as well as reviewing the written report by the radiologist.  ED MD interpretation: 2.8 cm left ovarian cyst noted.  Official radiology report(s): [place] Pelvic Complete With Transvaginal  Result Date: 03/04/2018 CLINICAL DATA:  Pelvic pain for 1 day EXAM: TRANSABDOMINAL AND TRANSVAGINAL ULTRASOUND OF PELVIS TECHNIQUE: Both transabdominal and transvaginal ultrasound examinations of the pelvis were performed. Transabdominal technique was performed for global imaging of the pelvis including uterus, ovaries, adnexal regions, and pelvic cul-de-sac. It was necessary to proceed with endovaginal exam following the transabdominal exam to visualize the uterus endometrium ovaries. COMPARISON:  None FINDINGS: Uterus Measurements: 6 cm length by 2.5 cm height by 3.4 cm wide = volume: 27.2 mL. No fibroids or other mass visualized. Endometrium Thickness: 4 mm.  No focal abnormality visualized. Right ovary Measurements: 3.1 x 2.5 x 1.8 cm = volume: 7.4 mL. Iso to slightly hypoechoic area in the right ovary measuring 1.5 cm. Left ovary Measurements: 3.3 x 3 x 2.6 cm = volume: 13.1 mL. Cyst measuring 2.8 x 2.5 x 2.1 cm Other findings No abnormal free fluid. IMPRESSION: 1. 2.8 cm cyst in the left ovary. 2. 1.5 cm mildly hypoechoic area in the right ovary, possible hemorrhagic follicle. 3. Otherwise negative pelvic ultrasound Electronically Signed   By: Jasmine PangKim  Fujinaga M.D.   On: 03/04/2018 20:31       Procedures   ____________________________________________   INITIAL IMPRESSION / ASSESSMENT AND PLAN / ED COURSE  As part of my medical decision making, I reviewed the following data within the electronic MEDICAL RECORD NUMBER 28 year old female present with above-stated history and physical exam concerning for possible PID cystitis ovarian cysts.  Ultrasound revealed a 2.8 cm left ovarian cyst patient had cervical motion tenderness on exam and as such we will treat for PID. ____________________________________________  FINAL CLINICAL IMPRESSION(S) / ED DIAGNOSES  Final diagnoses:  Pelvic pain  Left ovarian cyst  Acute cystitis without hematuria  PID (acute pelvic inflammatory disease)     MEDICATIONS GIVEN DURING THIS VISIT:  Medications  lidocaine (PF) (XYLOCAINE) 1 % injection (has no administration in time range)  cefTRIAXone (ROCEPHIN) injection 250 mg (250 mg Intramuscular Given 03/04/18 2118)     ED Discharge Orders         Ordered    doxycycline (VIBRAMYCIN) 100 MG capsule  2 times daily     03/04/18 2109           Note:  This document was prepared using Dragon voice recognition software and may include unintentional dictation errors.    Darci CurrentBrown, Wallace N, MD 03/04/18 [id number]

## 2018-03-11 ENCOUNTER — Other Ambulatory Visit: Payer: Self-pay

## 2018-03-11 ENCOUNTER — Emergency Department
Admission: EM | Admit: 2018-03-11 | Discharge: 2018-03-11 | Disposition: A | Discharge status: Home / Self Care | Payer: Medicaid Other | Attending: Emergency Medicine | Admitting: Emergency Medicine

## 2018-03-11 ENCOUNTER — Encounter: Payer: Self-pay | Admitting: Emergency Medicine

## 2018-03-11 DIAGNOSIS — Z79899 Other long term (current) drug therapy: Secondary | ICD-10-CM | POA: Insufficient documentation

## 2018-03-11 DIAGNOSIS — Z3202 Encounter for pregnancy test, result negative: Secondary | ICD-10-CM

## 2018-03-11 LAB — POCT PREGNANCY, URINE: PREG TEST UR: NEGATIVE

## 2018-03-11 NOTE — ED Provider Notes (Signed)
[facility] Emergency Department Provider Note  ____________________________________________  Time seen: Approximately 11:54 PM  I have reviewed the triage vital signs and the nursing notes.   HISTORY  Chief Complaint Possible Pregnancy   HPI Festus HoltsStephanie J Caiazzo is a 28 y.o. female presents to the emergency department requesting a pregnancy test.  She states that she took one that she had bought at the [employer] store which was positive and then took a second test at home which was negative.  She has not actually missed a menstrual cycle.  She has no symptoms of concern.   Past Medical History:  Diagnosis Date  . No known health problems     There are no active problems to display for this patient.   Past Surgical History:  Procedure Laterality Date  . NO PAST SURGERIES      Prior to Admission medications   Medication Sig Start Date End Date Taking? Authorizing Provider  doxycycline (VIBRAMYCIN) 100 MG capsule Take 1 capsule (100 mg total) by mouth 2 (two) times daily for 14 days. 03/04/18 03/18/18  Darci CurrentBrown, [place] N, MD  Nutritional Supplements (CONCEPTIONXR REPRODUCTIVE) TABS Take by mouth.    [provider]  Prenatal Vit-Fe Fumarate-FA (MULTIVITAMIN-PRENATAL) 27-0.8 MG TABS tablet Take 1 tablet by mouth daily at 12 noon.    [provider]    Allergies Tramadol  Family History  Problem Relation Age of Onset  . Diabetes Maternal Grandmother     Social History Social History   Tobacco Use  . Smoking status: Never Smoker  . Smokeless tobacco: Never Used  Substance Use Topics  . Alcohol use: Yes  . Drug use: No    Review of Systems Constitutional: Negative for fever. ENT: Negative for sore throat. Respiratory: Negative for cough Gastrointestinal: No abdominal pain.  No nausea, no vomiting.  No diarrhea.  Musculoskeletal: Negative for generalized body aches. Skin: Negative for rash/lesion/wound. Neurological: Negative  for headaches, focal weakness or numbness.  ____________________________________________   PHYSICAL EXAM:  VITAL SIGNS: ED Triage Vitals  Enc Vitals Group     BP 03/11/18 2223 (!) 136/114     Pulse Rate 03/11/18 2223 88     Resp 03/11/18 2223 18     Temp 03/11/18 2223 97.6 F (36.4 C)     Temp Source 03/11/18 2223 Oral     SpO2 03/11/18 2223 100 %     Weight 03/11/18 2224 180 lb (81.6 kg)     Height 03/11/18 2224 5\' 1"  (1.549 m)     Head Circumference --      Peak Flow --      Pain Score 03/11/18 2224 1     Pain Loc --      Pain Edu? --      Excl. in GC? --     Constitutional: Alert and oriented. Well appearing and in no acute distress. Eyes: Conjunctivae are normal. PERRL. EOMI. Head: Atraumatic. Nose: No congestion/rhinnorhea. Mouth/Throat: Mucous membranes are moist. Neck: No stridor.  Cardiovascular: Normal rate, regular rhythm. Good peripheral circulation. Respiratory: Normal respiratory effort. Musculoskeletal: Full ROM throughout.  Neurologic:  Normal speech and language. No gross focal neurologic deficits are appreciated. Speech is normal. No gait instability. Skin:  Skin is warm, dry and intact. No rash noted. Psychiatric: Mood and affect are normal. Speech and behavior are normal.  ____________________________________________   LABS (all labs ordered are listed, but only abnormal results are displayed)  Labs Reviewed  POC URINE PREG, ED  POCT PREGNANCY,  URINE   ____________________________________________  EKG  Not indicated ____________________________________________  RADIOLOGY  Not indicated ____________________________________________   PROCEDURES  None ____________________________________________   INITIAL IMPRESSION / ASSESSMENT AND PLAN / ED COURSE     Pertinent labs & imaging results that were available during my care of the patient were reviewed by me and considered in my medical decision making (see chart for  details).  Patient was advised to keep the appointment with her gynecologist as scheduled. ____________________________________________   FINAL CLINICAL IMPRESSION(S) / ED DIAGNOSES  Final diagnoses:  Encounter for pregnancy test with result negative       Chinita Pester, FNP 03/11/18 2356    Minna Antis, MD 03/12/18 2314

## 2018-03-11 NOTE — ED Triage Notes (Signed)
Pt presents to ED wanting a pregnancy test. Pt states she took one at the [employer] store and had a positive pregnancy test but then took a expired test she had at home and it was negative. Period is not due until 1/14 or 1/18 so she has not yet missed a period. Pt states she has an appt with obgyn next week but doesn't want to wait that long. Denies any other symptoms. Pt talkative with no distress noted.

## 2018-03-23 ENCOUNTER — Ambulatory Visit (INDEPENDENT_AMBULATORY_CARE_PROVIDER_SITE_OTHER): Payer: Self-pay | Admitting: Obstetrics and Gynecology

## 2018-03-23 ENCOUNTER — Other Ambulatory Visit (HOSPITAL_COMMUNITY)
Admission: RE | Admit: 2018-03-23 | Discharge: 2018-03-23 | Disposition: A | Discharge status: Home / Self Care | Payer: Medicaid Other | Source: Ambulatory Visit | Attending: Obstetrics and Gynecology | Admitting: Obstetrics and Gynecology

## 2018-03-23 ENCOUNTER — Encounter: Payer: Self-pay | Admitting: Obstetrics and Gynecology

## 2018-03-23 VITALS — BP 108/75 | HR 89 | Ht 61.0 in | Wt 196.2 lb

## 2018-03-23 DIAGNOSIS — N979 Female infertility, unspecified: Secondary | ICD-10-CM

## 2018-03-23 DIAGNOSIS — N83201 Unspecified ovarian cyst, right side: Secondary | ICD-10-CM

## 2018-03-23 DIAGNOSIS — N941 Unspecified dyspareunia: Secondary | ICD-10-CM

## 2018-03-23 DIAGNOSIS — Z124 Encounter for screening for malignant neoplasm of cervix: Secondary | ICD-10-CM | POA: Insufficient documentation

## 2018-03-23 NOTE — Patient Instructions (Signed)
Ovarian Cyst     An ovarian cyst is a fluid-filled sac that forms on an ovary. The ovaries are small organs that produce eggs in women. Various types of cysts can form on the ovaries. Some may cause symptoms and require treatment. Most ovarian cysts go away on their own, are not cancerous (are benign), and do not cause problems. Common types of ovarian cysts include:  Functional (follicle) cysts. ? Occur during the menstrual cycle, and usually go away with the next menstrual cycle if you do not get pregnant. ? Usually cause no symptoms.  Endometriomas. ? Are cysts that form from the tissue that lines the uterus (endometrium). ? Are sometimes called "chocolate cysts" because they become filled with blood that turns brown. ? Can cause pain in the lower abdomen during intercourse and during your period.  Cystadenoma cysts. ? Develop from cells on the outside surface of the ovary. ? Can get very large and cause lower abdomen pain and pain with intercourse. ? Can cause severe pain if they twist or break open (rupture).  Dermoid cysts. ? Are sometimes found in both ovaries. ? May contain different kinds of body tissue, such as skin, teeth, hair, or cartilage. ? Usually do not cause symptoms unless they get very big.  Theca lutein cysts. ? Occur when too much of a certain hormone (human chorionic gonadotropin) is produced and overstimulates the ovaries to produce an egg. ? Are most common after having procedures used to assist with the conception of a baby (in vitro fertilization). What are the causes? Ovarian cysts may be caused by:  Ovarian hyperstimulation syndrome. This is a condition that can develop from taking fertility medicines. It causes multiple large ovarian cysts to form.  Polycystic ovarian syndrome (PCOS). This is a common hormonal disorder that can cause ovarian cysts, as well as problems with your period or fertility. What increases the risk? The following factors may  make you more likely to develop ovarian cysts:  Being overweight or obese.  Taking fertility medicines.  Taking certain forms of hormonal birth control.  Smoking. What are the signs or symptoms? Many ovarian cysts do not cause symptoms. If symptoms are present, they may include:  Pelvic pain or pressure.  Pain in the lower abdomen.  Pain during sex.  Abdominal swelling.  Abnormal menstrual periods.  Increasing pain with menstrual periods. How is this diagnosed? These cysts are commonly found during a routine pelvic exam. You may have tests to find out more about the cyst, such as:  Ultrasound.  X-ray of the pelvis.  CT scan.  MRI.  Blood tests. How is this treated? Many ovarian cysts go away on their own without treatment. Your health care provider may want to check your cyst regularly for 2-3 months to see if it changes. If you are in menopause, it is especially important to have your cyst monitored closely because menopausal women have a higher rate of ovarian cancer. When treatment is needed, it may include:  Medicines to help relieve pain.  A procedure to drain the cyst (aspiration).  Surgery to remove the whole cyst.  Hormone treatment or birth control pills. These methods are sometimes used to help dissolve a cyst. Follow these instructions at home:  Take over-the-counter and prescription medicines only as told by your health care provider.  Do not drive or use heavy machinery while taking prescription pain medicine.  Get regular pelvic exams and Pap tests as often as told by your health care provider.    Return to your normal activities as told by your health care provider. Ask your health care provider what activities are safe for you.  Do not use any products that contain nicotine or tobacco, such as cigarettes and e-cigarettes. If you need help quitting, ask your health care provider.  Keep all follow-up visits as told by your health care provider.  This is important. Contact a health care provider if:  Your periods are late, irregular, or painful, or they stop.  You have pelvic pain that does not go away.  You have pressure on your bladder or trouble emptying your bladder completely.  You have pain during sex.  You have any of the following in your abdomen: ? A feeling of fullness. ? Pressure. ? Discomfort. ? Pain that does not go away. ? Swelling.  You feel generally ill.  You become constipated.  You lose your appetite.  You develop severe acne.  You start to have more body hair and facial hair.  You are gaining weight or losing weight without changing your exercise and eating habits.  You think you may be pregnant. Get help right away if:  You have abdominal pain that is severe or gets worse.  You cannot eat or drink without vomiting.  You suddenly develop a fever.  Your menstrual period is much heavier than usual. This information is not intended to replace advice given to you by your health care provider. Make sure you discuss any questions you have with your health care provider. Document Released: 02/17/2005 Document Revised: 09/07/2015 Document Reviewed: 07/22/2015 Elsevier Interactive Patient Education  2019 [employer].   Female Infertility  Female infertility refers to a woman's inability to get pregnant (conceive) after a year of having sex regularly (or after 6 months in women over age 68) without using birth control. Infertility can also mean that a woman is not able to carry a pregnancy to full term. Both women and men can have fertility problems. What are the causes? This condition may be caused by:  Problems with reproductive organs. Infertility can result if a woman: ? Has an abnormally short cervix or a cervix that does not remain closed during a pregnancy. ? Has a blockage or scarring in the fallopian tubes. ? Has an abnormally shaped uterus. ? Has uterine fibroids. This is a benign  mass of tissue or muscle (tumor) that can develop in the uterus. ? Is not ovulating in a regular way.  Certain medical conditions. These may include: ? Polycystic ovary syndrome (PCOS). This is a hormonal disorder that can cause small cysts to grow on the ovaries. This is the most common cause of infertility in women. ? Endometriosis. This is a condition in which the tissue that lines the uterus (endometrium) grows outside of its normal location. ? Cancer and cancer treatments, such as chemotherapy or radiation. ? Premature ovarian failure. This is when ovaries stop producing eggs and hormones before age 46. ? Sexually transmitted diseases, such as chlamydia or gonorrhea. ? Autoimmune disorders. These are disorders in which the body's defense system (immune system) attacks normal, healthy cells. Infertility can be linked to more than one cause. For some women, the cause of infertility is not known (unexplained infertility). What increases the risk?  Age. A woman's fertility declines with age, especially after her mid-30s.  Being underweight or overweight.  Drinking too much alcohol.  Using drugs such as anabolic steroids, cocaine, and marijuana.  Exercising excessively.  Being exposed to environmental toxins, such as radiation,  pesticides, and certain chemicals. What are the signs or symptoms? The main sign of infertility in women is the inability to get pregnant or carry a pregnancy to full term. How is this diagnosed? This condition may be diagnosed by:  Checking whether you are ovulating each month. The tests may include: ? Blood tests to check hormone levels. ? An ultrasound of the ovaries. ? Taking a small tissue that lines the uterus and checking it under a microscope (endometrial biopsy).  Doing additional tests. This is done if ovulation is normal. Tests may include: ? Hysterosalpingography. This X-ray test can show the shape of the uterus and whether the fallopian tubes  are open. ? Laparoscopy. This test uses a lighted tube (laparoscope) to look for problems in the fallopian tubes and other organs. ? Transvaginal ultrasound. This imaging test is used to check for abnormalities in the uterus and ovaries. ? Hysteroscopy. This test uses a lighted tube to check for problems in the cervix and the uterus. To be diagnosed with infertility, both partners will have a physical exam. Both partners will also have an extensive medical and sexual history taken. Additional tests may be done. How is this treated? Treatment depends on the cause of infertility. Most cases of infertility in women are treated with medicine or surgery.  Women may take medicine to: ? Correct ovulation problems. ? Treat other health conditions.  Surgery may be done to: ? Repair damage to the ovaries, fallopian tubes, cervix, or uterus. ? Remove growths from the uterus. ? Remove scar tissue from the uterus, pelvis, or other organs. Assisted reproductive technology (ART) Assisted reproductive technology (ART) refers to all treatments and procedures that combine eggs and sperm outside the body to try to help a couple conceive. ART is often combined with fertility drugs to stimulate ovulation. Sometimes ART is done using eggs retrieved from another woman's body (donor eggs) or from previously frozen fertilized eggs (embryos). There are different types of ART. These include:  Intrauterine insemination (IUI). A long, thin tube is used to place sperm directly into a woman's uterus. This procedure: ? Is effective for infertility caused by sperm problems, including low sperm count and low motility. ? Can be used in combination with fertility drugs.  In vitro fertilization (IVF). This is done when a woman's fallopian tubes are blocked or when a man has low sperm count. In this procedure: ? Fertility drugs are used to stimulate the ovaries to produce multiple eggs. ? Once mature, these eggs are removed  from the body and combined with the sperm to be fertilized. ? The fertilized eggs are then placed into the woman's uterus. Follow these instructions at home:  Take over-the-counter and prescription medicines only as told by your health care provider.  Do not use any products that contain nicotine or tobacco, such as cigarettes and e-cigarettes. If you need help quitting, ask your health care provider.  If you drink alcohol, limit how much you have to 1 drink a day.  Make dietary changes to lose weight or maintain a healthy weight. Work with your health care provider and a dietitian to set a weight-loss goal that is healthy and reasonable for you.  Seek support from a counselor or support group to talk about your concerns related to infertility. Couples counseling may be helpful for you and your partner.  Practice stress reduction techniques that work well for you, such as regular physical activity, meditation, or deep breathing.  Keep all follow-up visits as told by  your health care provider. This is important. Contact a health care provider if you:  Feel that stress is interfering with your life and relationships.  Have side effects from treatments for infertility. Summary  Female infertility refers to a woman's inability to get pregnant (conceive) after a year of having sex regularly (or after 6 months in women over age 1) without using birth control.  To be diagnosed with infertility, both partners will have a physical exam. Both partners will also have an extensive medical and sexual history taken.  Seek support from a counselor or support group to talk about your concerns related to infertility. Couples counseling may be helpful for you and your partner. This information is not intended to replace advice given to you by your health care provider. Make sure you discuss any questions you have with your health care provider. Document Released: 02/20/2003 Document Revised: 01/19/2017  Document Reviewed: 01/19/2017 Elsevier Interactive Patient Education  2019 [employer].   Preparing for Pregnancy If you are considering becoming pregnant, make an appointment to see your regular health care provider to learn how to prepare for a safe and healthy pregnancy (preconception care). During a preconception care visit, your health care provider will:  Do a complete physical exam, including a Pap test.  Take a complete medical history.  Give you information, answer your questions, and help you resolve problems. Preconception checklist Medical history  Tell your health care provider about any current or past medical conditions. Your pregnancy or your ability to become pregnant may be affected by chronic conditions, such as diabetes, chronic hypertension, and thyroid problems.  Include your family's medical history as well as your partner's medical history.  Tell your health care provider about any history of STIs (sexually transmitted infections).These can affect your pregnancy. In some cases, they can be passed to your baby. Discuss any concerns that you have about STIs.  If indicated, discuss the benefits of genetic testing. This testing will show whether there are any genetic conditions that may be passed from you or your partner to your baby.  Tell your health care provider about: ? Any problems you have had with conception or pregnancy. ? Any medicines you take. These include vitamins, herbal supplements, and over-the-counter medicines. ? Your history of immunizations. Discuss any vaccinations that you may need. Diet  Ask your health care provider what to include in a healthy diet that has a balance of nutrients. This is especially important when you are pregnant or preparing to become pregnant.  Ask your health care provider to help you reach a healthy weight before pregnancy. ? If you are overweight, you may be at higher risk for certain complications, such as high  blood pressure, diabetes, and preterm birth. ? If you are underweight, you are more likely to have a baby who has a low birth weight. Lifestyle, work, and home  Let your health care provider know: ? About any lifestyle habits that you have, such as alcohol use, drug use, or smoking. ? About recreational activities that may put you at risk during pregnancy, such as downhill skiing and certain exercise programs. ? Tell your health care provider about any international travel, especially any travel to places with an active [place] virus outbreak. ? About harmful substances that you may be exposed to at work or at home. These include chemicals, pesticides, radiation, or even litter boxes. ? If you do not feel safe at home. Mental health  Tell your health care provider about: ?  Any history of mental health conditions, including feelings of depression, sadness, or anxiety. ? Any medicines that you take for a mental health condition. These include herbs and supplements. Home instructions to prepare for pregnancy Lifestyle   Eat a balanced diet. This includes fresh fruits and vegetables, whole grains, lean meats, low-fat dairy products, healthy fats, and foods that are high in fiber. Ask to meet with a nutritionist or registered dietitian for assistance with meal planning and goals.  Get regular exercise. Try to be active for at least 30 minutes a day on most days of the week. Ask your health care provider which activities are safe during pregnancy.  Do not use any products that contain nicotine or tobacco, such as cigarettes and e-cigarettes. If you need help quitting, ask your health care provider.  Do not drink alcohol.  Do not take illegal drugs.  Maintain a healthy weight. Ask your health care provider what weight range is right for you. General instructions  Keep an accurate record of your menstrual periods. This makes it easier for your health care provider to determine your baby's due  date.  Begin taking prenatal vitamins and folic acid supplements daily as directed by your health care provider.  Manage any chronic conditions, such as high blood pressure and diabetes, as told by your health care provider. This is important. How do I know that I am pregnant? You may be pregnant if you have been sexually active and you miss your period. Symptoms of early pregnancy include:  Mild cramping.  Very light vaginal bleeding (spotting).  Feeling unusually tired.  Nausea and vomiting (morning sickness). If you have any of these symptoms and you suspect that you might be pregnant, you can take a home pregnancy test. These tests check for a hormone in your urine (human chorionic gonadotropin, or hCG). A woman's body begins to make this hormone during early pregnancy. These tests are very accurate. Wait until at least the first day after you miss your period to take one. If the test shows that you are pregnant (you get a positive result), call your health care provider to make an appointment for prenatal care. What should I do if I become pregnant?      Make an appointment with your health care provider as soon as you suspect you are pregnant.  Do not use any products that contain nicotine, such as cigarettes, chewing tobacco, and e-cigarettes. If you need help quitting, ask your health care provider.  Do not drink alcoholic beverages. Alcohol is related to a number of birth defects.  Avoid toxic odors and chemicals.  You may continue to have sexual intercourse if it does not cause pain or other problems, such as vaginal bleeding. This information is not intended to replace advice given to you by your health care provider. Make sure you discuss any questions you have with your health care provider. Document Released: 01/31/2008 Document Revised: 02/19/2017 Document Reviewed: 09/09/2015 Elsevier Interactive Patient Education  2019 [employer]

## 2018-03-23 NOTE — Progress Notes (Signed)
Pt is present today following a ED visit. Pt stated that she was informed that she has cysts on her left ovary. Pt stated that she is having pain with intercourse and noticed that she takes a pregnancy test one day it will be positive and the next day when she takes one it will be negative. Pt stated that she is confused about that.

## 2018-03-23 NOTE — Progress Notes (Signed)
GYNECOLOGY PROGRESS NOTE  Subjective:    Patient ID: Jaime Harvey, female    DOB: Dec 29, 1990, 28 y.o.   MRN: [id number]  HPI  Patient is a 28 y.o. G0P0000 female who presents for:    1. Diagnosed with cyst on ovary 03/04/2018 after a visit to the Emergency Room for pelvic pain.  Was instructed to follow up with a GYN at that time.  She has not seen a Theatre manager in 3 or more years. Was noting pain at that time but pain is now gone. However she continues to note dyspareunia. She has had painful intercourse off and on for several months.   2. For 2 years has had alternating negative and positive pregnancy tests. Has been trying to get pregnant for the past 3 years with no success.  Has positive and negative tests at home with different brands of home UPTs (but mostly [employer] tests), but has negative tests when she presents to a doctor's office. Notes menstrual cycles are pretty regular, q 28-31 days. Ovulation testing done with kits occasionally which note that she is ovulating. She does have a history of long-term Depo use for 5 years, discontinued  in 2018. Denies h/o STIs or previous pelvic surgeries. Long-term partner is age 17.    Past Medical History:  Diagnosis Date  . No known health problems     OB History  Gravida Para Term Preterm AB Living  0 0 0 0 0 0  SAB TAB Ectopic Multiple Live Births  0 0 0 0 0    Family History  Problem Relation Age of Onset  . Healthy Mother   . Diabetes Maternal Grandmother   . Hypertension Maternal Grandmother   . Healthy Father   . Hypotension Maternal Grandfather   . Diabetes Maternal Grandfather     Social History   Socioeconomic History  . Marital status: Married    Spouse name: Not on file  . Number of children: Not on file  . Years of education: Not on file  . Highest education level: Not on file  Occupational History  . Not on file  Social Needs  . Financial resource strain: Not on file  . Food insecurity:   Worry: Not on file    Inability: Not on file  . Transportation needs:    Medical: Not on file    Non-medical: Not on file  Tobacco Use  . Smoking status: Never Smoker  . Smokeless tobacco: Never Used  Substance and Sexual Activity  . Alcohol use: Not Currently  . Drug use: No  . Sexual activity: Yes    Birth control/protection: None  Lifestyle  . Physical activity:    Days per week: 0 days    Minutes per session: 0 min  . Stress: Rather much  Relationships  . Social connections:    Talks on phone: Not on file    Gets together: Not on file    Attends religious service: Not on file    Active member of club or organization: Not on file    Attends meetings of clubs or organizations: Not on file    Relationship status: Not on file  . Intimate partner violence:    Fear of current or ex partner: Not on file    Emotionally abused: Not on file    Physically abused: Not on file    Forced sexual activity: Not on file  Other Topics Concern  . Not on file  Social History Narrative  .  Not on file    Past Surgical History:  Procedure Laterality Date  . NO PAST SURGERIES      Current Outpatient Medications on File Prior to Visit  Medication Sig Dispense Refill  . ibuprofen (ADVIL,MOTRIN) 200 MG tablet Take 200 mg by mouth every 6 (six) hours as needed.     No current facility-administered medications on file prior to visit.     Allergies  Allergen Reactions  . Tramadol Other (See Comments)    "Panic attack and anxiety"     Review of Systems Pertinent items noted in HPI and remainder of comprehensive ROS otherwise negative.   Objective:   Blood pressure 108/75, pulse 89, height 5\' 1"  (1.549 m), weight 196 lb 3.2 oz (89 kg), last menstrual period 03/19/2018. General appearance: alert and no distress Abdomen: soft, non-tender; bowel sounds normal; no masses,  no organomegaly Pelvic: external genitalia normal, rectovaginal septum normal.  Vagina without discharge.  Cervix  normal appearing, no lesions and no motion tenderness.  Uterus mobile, nontender, normal shape and size.  Adnexae non-palpable, nontender bilaterally.  Extremities: extremities normal, atraumatic, no cyanosis or edema Neurologic: Grossly normal    Imaging:  US PELVIC COMPLETE WITH TRANSVAGINAL CLINICAL DATA:  Pelvic pain for 1 day  EXAM: TRANSABDOMINAL AND TRANSVAGINAL ULTRASOUND OF PELVIS  TECHNIQUE: Both transabdominal and transvaginal ultrasound examinations of the pelvis were performed. Transabdominal technique was performed for global imaging of the pelvis including uterus, ovaries, adnexal regions, and pelvic cul-de-sac. It was necessary to proceed with endovaginal exam following the transabdominal exam to visualize the uterus endometrium ovaries.  COMPARISON:  None  FINDINGS: Uterus  Measurements: 6 cm length by 2.5 cm height by 3.4 cm wide = volume: 27.2 mL. No fibroids or other mass visualized.  Endometrium  Thickness: 4 mm.  No focal abnormality visualized.  Right ovary  Measurements: 3.1 x 2.5 x 1.8 cm = volume: 7.4 mL. Iso to slightly hypoechoic area in the right ovary measuring 1.5 cm.  Left ovary  Measurements: 3.3 x 3 x 2.6 cm = volume: 13.1 mL. Cyst measuring 2.8 x 2.5 x 2.1 cm  Other findings  No abnormal free fluid.  IMPRESSION: 1. 2.8 cm cyst in the left ovary. 2. 1.5 cm mildly hypoechoic area in the right ovary, possible hemorrhagic follicle. 3. Otherwise negative pelvic ultrasound  Electronically Signed   By: Jasmine PangKim  Fujinaga M.D.   On: 03/04/2018 20:31   Assessment:   Cyst of right ovary  Infertility, female Dyspareunia in female  Plan:   1.  Cyst of right ovary.  Small ovarian cysts noted on ovaries bilaterally, 1 appears simple, the other appears hemorrhagic.  Discussed that these would likely resolve over time without any intervention.  Are not the likely cause of her pain and dyspareunia. 2.  Infertility.  Patient notes that  she and her partner have been trying for at least 3 years.  Discussed different etiologies of infertility, including her long-term history of Depo-Provera use, or dyspareunia which could be a symptom of a artery disease process such as endometriosis which is been known to be a factor in infertility, or could be female factor.  Advised patient to use higher quality pregnancy test when suspicious for pregnancy.  Also encouraged the patient can continue to use ovulation kits or can perform basal temperature measurements.  We will also order work-up of basic labs to assess for other causes of infertility such as PCOS.  Discussed that if all testing is normal, can discuss use  of ovulation induction medications to enhance chances.  Also discussed timed coitus. 3. Dyspareunia of unknown cause. Has recently had STD screening at ER visit. Patient denies rough intercourse, and notes use of lubricants when needed. Other causes include diagnoses such as endometriosis, pelvic floor dysfunction (although no significant findings on pelvic exam today). Pap smear performed today as patient has not had any gynecologic care.    A total of 30 minutes were spent face-to-face with the patient during the encounter with greater than 50% dealing with counseling and coordination of care.    Hildred Laserherry, Kelisha Dall, MD [facility]

## 2018-03-25 ENCOUNTER — Encounter: Payer: Self-pay | Admitting: Obstetrics and Gynecology

## 2018-03-26 LAB — CYTOLOGY - PAP: Diagnosis: NEGATIVE

## 2018-04-01 ENCOUNTER — Telehealth: Payer: Self-pay

## 2018-04-01 NOTE — Telephone Encounter (Signed)
-----   Message from Hildred Laser, MD sent at 03/29/2018  8:54 PM EST ----- Please inform patient of normal pap smear.  I thought that she also had labs drawn that day, but it appears as if the labs are still waiting to be collected.

## 2018-04-01 NOTE — Telephone Encounter (Signed)
Pt called no answer LM informing pt of her test result and and advised pt to call the office to set up an appointment to have her labs drawn.

## 2018-04-21 ENCOUNTER — Telehealth: Payer: Self-pay | Admitting: Obstetrics and Gynecology

## 2018-04-21 NOTE — Telephone Encounter (Signed)
The patient called and stated that she is late on her menstrual cycle and is experiencing some cramping and and light spotting with negative pregnancy tests. The patient also stated she is at work and stated she is okay with a nurse leaving a voicemail. Please advise.

## 2018-04-22 NOTE — Telephone Encounter (Signed)
Spoke with pt and she stated that she started her cycle today. Pt is trying to get pregnant and wants to know what to do to get pregnant and wants to make an appointment to see [facility]. Pt made an appointment today.

## 2018-05-18 ENCOUNTER — Encounter: Payer: Self-pay | Admitting: Obstetrics and Gynecology

## 2018-05-18 ENCOUNTER — Ambulatory Visit (INDEPENDENT_AMBULATORY_CARE_PROVIDER_SITE_OTHER): Payer: Self-pay | Admitting: Obstetrics and Gynecology

## 2018-05-18 ENCOUNTER — Other Ambulatory Visit: Payer: Self-pay

## 2018-05-18 VITALS — BP 113/70 | HR 75 | Ht 61.0 in | Wt 193.4 lb

## 2018-05-18 DIAGNOSIS — N83202 Unspecified ovarian cyst, left side: Secondary | ICD-10-CM

## 2018-05-18 DIAGNOSIS — Z319 Encounter for procreative management, unspecified: Secondary | ICD-10-CM

## 2018-05-18 DIAGNOSIS — R1030 Lower abdominal pain, unspecified: Secondary | ICD-10-CM

## 2018-05-18 NOTE — Progress Notes (Signed)
    GYNECOLOGY PROGRESS NOTE  Subjective:    Patient ID: Jaime Harvey, female    DOB: [birth year], 28 y.o.   MRN: [id number]  HPI  Patient is a 28 y.o. G0P0000 female who presents for further discussion of conception.  Notes that she has some confusion regarding timed coitus, and would like to review it again.  She also notes that intercourse has been more difficult as her partner has noted testicular pain with intercourse.  States that he is planning to be seen by his PCP soon. She is taking a pre-conception vitamins.   She also complains of lower abnormal pain that occurred 1 day last week, . Pain was sharp and spread to the vaginal region.  It lasted for a few minutes and then went away.  She does have a history of a recent ovarian cyst. She also notes a yellowish discharge that started yesterday, however she has noted this discharge for a while as it occurs right before her cycle starts, and is gone afterwards.  She denies any itching and burning.   The following portions of the patient's history were reviewed and updated as appropriate: allergies, current medications, past family history, past medical history, past social history, past surgical history and problem list.  Review of Systems Pertinent items noted in HPI and remainder of comprehensive ROS otherwise negative.   Objective:   Blood pressure 113/70, pulse 75, height 5\' 1"  (1.549 m), weight 193 lb 6.4 oz (87.7 kg), last menstrual period 04/21/2018. General appearance: alert and no distress Abdomen: soft, non-tender; bowel sounds normal; no masses,  no organomegaly Pelvic: external genitalia normal, rectovaginal septum normal.  Vagina without discharge.  Cervix normal appearing, no lesions and no motion tenderness.  Uterus mobile, nontender, normal shape and size.  Adnexae non-palpable, nontender bilaterally.  Extremities: extremities normal, atraumatic, no cyanosis or edema Neurologic: Grossly normal   Assessment:    Infertility Lower abdominal pain Ovarian cyst (left)  Plan:   - Infertility: reviewed timed coitus steps again with patient. Also encouraged patient to f/u after her partner is assessed to see if other measures need to be taken with regards to infertility workup.  - Lower abdominal pain: had 1 occurrence which has since resolved.  Discussed that it could be secondary to possible ovarian cyst rupture since she did have a small one (2.8 cm) on the left. No further workup needed at this time.   - RTC as needed. Otherwise, to f/u in 3 months if no pregnancy has occurred to discuss initiation of medications for conception if no spontaneous conception occurs.    Hildred Laser, MD [facility]

## 2018-05-18 NOTE — Progress Notes (Signed)
Pt stated that she is having sharp pains in the abd area that spreads to the vaginal area. Pt stated noticing yellow discharge.

## 2018-05-20 DIAGNOSIS — Z8742 Personal history of other diseases of the female genital tract: Secondary | ICD-10-CM | POA: Insufficient documentation

## 2018-05-20 DIAGNOSIS — N83202 Unspecified ovarian cyst, left side: Secondary | ICD-10-CM | POA: Insufficient documentation

## 2018-05-21 LAB — INSULIN, FREE AND TOTAL
Free Insulin: 14 uU/mL
Total Insulin: 14 uU/mL

## 2018-05-21 LAB — TSH: TSH: 1.27 u[IU]/mL (ref 0.450–4.500)

## 2018-05-21 LAB — TESTOSTERONE, FREE, TOTAL, SHBG
Sex Hormone Binding: 39.1 nmol/L (ref 24.6–122.0)
Testosterone, Free: 2.8 pg/mL (ref 0.0–4.2)
Testosterone: 53 ng/dL — ABNORMAL HIGH (ref 8–48)

## 2018-05-21 LAB — FSH/LH
FSH: 4.6 m[IU]/mL
LH: 6.7 m[IU]/mL

## 2018-05-21 LAB — ESTRADIOL: Estradiol: 70 pg/mL

## 2018-05-21 LAB — PROGESTERONE: Progesterone: 0.9 ng/mL

## 2018-05-28 ENCOUNTER — Telehealth: Payer: Self-pay | Admitting: Obstetrics and Gynecology

## 2018-05-28 NOTE — Telephone Encounter (Signed)
The patient called and stated that she would like to check the status of her most recent results. Please advise.  °

## 2018-06-04 NOTE — Telephone Encounter (Signed)
Pt called no answer LM via voicemail to call the office to speak more about her test results.

## 2018-06-14 ENCOUNTER — Telehealth: Payer: Self-pay | Admitting: Obstetrics and Gynecology

## 2018-06-14 NOTE — Telephone Encounter (Signed)
Patient called requesting lab results from 3/17.Thanks

## 2018-06-15 NOTE — Telephone Encounter (Signed)
Patient is waiting to get her partner in with a new provider to be checked out. I have given her the massage and she will call back after he has been seen.

## 2018-06-15 NOTE — Telephone Encounter (Signed)
I had already responded to this patient in Mychart however I don't see my message anymore.  Her labs were normal except her testosterone was mildly elevated (however it should not affect her attempting to get pregnant).  She was supposed to let me know what happened with her partner being evaluated as he was noting scrotal pain with intercourse. If no further workup for him was indicated, I advised that she can begin use of fertility medications with her next cycle.  Dr. Valentino Saxon

## 2018-07-08 ENCOUNTER — Telehealth: Payer: Self-pay | Admitting: Obstetrics and Gynecology

## 2018-07-08 NOTE — Telephone Encounter (Signed)
Pt was called to get the information of her husband/boyfriends name and DOB to fill out his sperm form.

## 2018-07-08 NOTE — Telephone Encounter (Signed)
Pt stated that her husbands is unable to find somewhere to go to be tested. Pt was informed that she could come by the office and pick up a sperm form for her husband.

## 2018-07-08 NOTE — Telephone Encounter (Signed)
The patient called and stated that she was recently tested for pcos for infertility. The patient was advised that her husband should also be tested for any "infirtility causing issues" The patient is having difficulty finding a provider for a spouse and has several questions for Dr. Valentino Saxon or the nurse, and is also wanting to know if Dr. Valentino Saxon can assist in testing for him. Please advise.

## 2018-07-30 ENCOUNTER — Telehealth: Payer: Self-pay

## 2018-07-30 NOTE — Telephone Encounter (Signed)
Spoke with pt and went over test results with pt.  

## 2018-07-30 NOTE — Telephone Encounter (Signed)
Pt is requesting semen analysis results.   Pls advise.

## 2018-09-13 ENCOUNTER — Telehealth: Payer: Self-pay

## 2018-09-13 NOTE — Telephone Encounter (Signed)
Pt called no answer LMTRC for prescreening. 

## 2018-09-14 ENCOUNTER — Ambulatory Visit (INDEPENDENT_AMBULATORY_CARE_PROVIDER_SITE_OTHER): Payer: Medicaid Other | Admitting: Obstetrics and Gynecology

## 2018-09-14 ENCOUNTER — Encounter: Payer: Self-pay | Admitting: Obstetrics and Gynecology

## 2018-09-14 ENCOUNTER — Other Ambulatory Visit: Payer: Self-pay

## 2018-09-14 VITALS — BP 98/58 | HR 80 | Ht 61.0 in | Wt 199.7 lb

## 2018-09-14 DIAGNOSIS — R638 Other symptoms and signs concerning food and fluid intake: Secondary | ICD-10-CM

## 2018-09-14 DIAGNOSIS — N979 Female infertility, unspecified: Secondary | ICD-10-CM

## 2018-09-14 MED ORDER — METFORMIN HCL 500 MG PO TABS: ORAL_TABLET | ORAL | 5 refills | Status: DC

## 2018-09-14 MED ORDER — CLOMIPHENE CITRATE 50 MG PO TABS: 50.0000 mg | ORAL_TABLET | Freq: Every day | ORAL | 3 refills | Status: DC

## 2018-09-14 NOTE — Progress Notes (Signed)
GYNECOLOGY PROGRESS NOTE  Subjective:    Patient ID: Jaime Harvey, female    DOB: 04/02/1990, 28 y.o.   MRN: [id number]  HPI  Patient is a 28 y.o. G0P0000 female who presents for management of infertility.  Patient states that her partner was examined and tested, and has had normal findings.  Denies major complaints today. Patient is noting that she is having regular periods.  Patient's last menstrual period was 08/18/2018.  She is tracking her cycles (usually between 27-31 days) and is using ovulation kits each month.   The following portions of the patient's history were reviewed and updated as appropriate: allergies, current medications, past family history, past medical history, past social history, past surgical history and problem list.  Review of Systems Pertinent items noted in HPI and remainder of comprehensive ROS otherwise negative.   Objective:   Blood pressure (!) 98/58, pulse 80, height 5\' 1"  (1.549 m), weight 199 lb 11.2 oz (90.6 kg), last menstrual period 08/18/2018. Body mass index is 37.73 kg/m.  General appearance: alert and no distress Remainder of exam deferred.    Imaging:  US PELVIC COMPLETE WITH TRANSVAGINAL CLINICAL DATA:  Pelvic pain for 1 day  EXAM: TRANSABDOMINAL AND TRANSVAGINAL ULTRASOUND OF PELVIS  TECHNIQUE: Both transabdominal and transvaginal ultrasound examinations of the pelvis were performed. Transabdominal technique was performed for global imaging of the pelvis including uterus, ovaries, adnexal regions, and pelvic cul-de-sac. It was necessary to proceed with endovaginal exam following the transabdominal exam to visualize the uterus endometrium ovaries.  COMPARISON:  None  FINDINGS: Uterus  Measurements: 6 cm length by 2.5 cm height by 3.4 cm wide = volume: 27.2 mL. No fibroids or other mass visualized.  Endometrium  Thickness: 4 mm.  No focal abnormality visualized.  Right ovary  Measurements: 3.1 x 2.5 x 1.8 cm =  volume: 7.4 mL. Iso to slightly hypoechoic area in the right ovary measuring 1.5 cm.  Left ovary  Measurements: 3.3 x 3 x 2.6 cm = volume: 13.1 mL. Cyst measuring 2.8 x 2.5 x 2.1 cm  Other findings  No abnormal free fluid.  IMPRESSION: 1. 2.8 cm cyst in the left ovary. 2. 1.5 cm mildly hypoechoic area in the right ovary, possible hemorrhagic follicle. 3. Otherwise negative pelvic ultrasound  Electronically Signed   By: Jasmine PangKim  Fujinaga M.D.   On: 03/04/2018 20:31    Labs:   Office Visit on 03/23/2018  Component Date Value Ref Range Status  . TSH 05/18/2018 1.270  0.450 - 4.500 uIU/mL Final  . Testosterone 05/18/2018 53* 8 - 48 ng/dL Final  . Testosterone, Free 05/18/2018 2.8  0.0 - 4.2 pg/mL Final  . Sex Hormone Binding 05/18/2018 39.1  24.6 - 122.0 nmol/L Final  . Estradiol 05/18/2018 70.0  pg/mL Final   Comment:                     Adult Female:                       Follicular phase   12.5 -   166.0                       Ovulation phase    85.8 -   498.0                       Luteal phase       43.8 -  211.0                       Postmenopausal     <6.0 -    54.7                     Pregnancy                       1st trimester     215.0 - >4300.0                     Girls (1-10 years)    6.0 -    27.0 Roche ECLIA methodology   . LH 05/18/2018 6.7  mIU/mL Final   Comment:                     Adult Female:                       Follicular phase      2.4 -  12.6                       Ovulation phase      14.0 -  95.6                       Luteal phase          1.0 -  11.4                       Postmenopausal        7.7 -  58.5   . [facility] 05/18/2018 4.6  mIU/mL Final   Comment:                     Adult Female:                       Follicular phase      3.5 -  12.5                       Ovulation phase       4.7 -  21.5                       Luteal phase          1.7 -   7.7                       Postmenopausal       25.8 - 134.8   . Free Insulin 05/18/2018 14   uU/mL Final   Comment: Reference Range: Pubertal Children and Adults (fasting): 0 - 17   . Total Insulin 05/18/2018 14  uU/mL Final   Comment: Non-Diabetic:  In the absence of insulin-binding antibodies, the free and total insulin assays are equivalent. However, this assay is intended for use in diabetics with insulin autoantibody present. Measurement is performed on acid-treated samples and, therefore, the sensitivity and absolute values by this method may differ from our direct insulin ICMA. Insulin Dependent Diabetic Patients:  Free Insulin levels vary depending on the capacity and affinity of circulating insulin-binding antibodies and the dose of insulin given to the patient.  Total insulin levels represent free insulin and antibody bound insulin fractions.   . Progesterone 05/18/2018 0.9  ng/mL  Final   Comment:                      Follicular phase       0.1 -   0.9                      Luteal phase           1.8 -  23.9                      Ovulation phase        0.1 -  12.0                      Pregnant                         First trimester    11.0 -  44.3                         Second trimester   25.4 -  83.3                         Third trimester    58.7 - 214.0                      Postmenopausal         0.0 -   0.1     Assessment:   Primary female infertility Increased BMI  Plan:   - Reviewed patient's labs and ultrasound results again.  Patient with borderline elevated testosterone levels, but not high enough to cause issues with infertility.  - Discussion had regarding management of her infertility.  Patient has been trying for several years without success.  Discussed trial of ovulation induction to increase chances of successful conception. Patient ok to initiate.  She was advised that the first day of bleeding will be Day 1 for her next cycle.  She was also given a Rx for Clomid 50mg  po qd to take on days 5-9 of her cycle.  The risks of Clomid including  ovarian hyperstimulation with possible risk of ovarian cancer as well as multiple gestation were discussed with patient.  Patient also advised to continue frequent intercourse especially around Day 14 of her upcoming cycle (qod intercourse around days 9 - 18).  By Day 35, patient was told to call if she had her period.  If patient has bleeding at end of cycle, will increase Clomid by 50mg  after 3 cycles; she can have a total of 6 cycles.  However if patient does not have bleeding, she was told to do a pregnancy test/come in for evaluation. - Increased BMI, patient notes that she is working on improving her diet and is walking more.  Will prescribe Metformin to help with weight management until conception occurs.  - RTC in 3 months. If no pregnancy by then, will also schedule for HSG. Did not yet perform as patient currently without any significant risk factors for tubal blockage (no h/o STDs/PID, endometriosis, ectopic pregnancy, or pelvic surgeries).     A total of 15 minutes were spent face-to-face with the patient during this encounter and over half of that time dealt with counseling and coordination of care.   Hildred Laserherry, Maysin Carstens, MD [facility]

## 2018-09-14 NOTE — Patient Instructions (Signed)
  CLOMID PATIENT INSTRUCTIONS  WHY USE IT? Clomid helps your ovaries to release eggs (ovulate).  HOW TO USE IT? Clomid is taken as a pill usually on days 5,6,7,8, & 9 of your cycle.  Day 1 is the first day of your period. The dose or duration may be changed to achieve ovulation.  Provera (progesterone) may first be used to bring on a period for some patients.  If you do not get pregnant this cycle, for your next cycles, take on days 1, 2, 3, 4 and 5.  If you do not get a period, take Provera 10 mg daily for 10 days to bring on a period; the first day you get bleeding is Day 1 of your cycle. The day of ovulation on Clomid is usually between cycle day 14 and 17.  Having sexual intercourse at least every other day between cycle day 13 and 18 will improve your chances of becoming pregnant during the Clomid cycle.  You may monitor your ovulation using basal body temperature charts or with ovulation kits.  If using the ovulation predictor kits, having intercourse the day of the surge and the two days following is recommended. If you get your period, call when it starts for an appointment with your doctor, so that an exam may be done, and another Clomid cycle can be considered if appropriate. If you do not get a period by day 35 of the cycle, please get a blood pregnancy test.  If it is negative, speak to your doctor for instructions to bring on another period and to plan a follow-up appointment.  THINGS TO KNOW: If you get pregnant while using Clomid, your chance of twins is 7% and triplets is less than 1%. Some studies have suggested the use of "fertility drugs" may increase your risk of ovarian cancers in the future.  It is unclear if these drugs increase the risk, or people who have problems with fertility are prone for these cancers.  If there is an actual risk, it is very low.  If you have a history of liver problems or ovarian cancer, it may be wise to avoid this medication.  SIDE EFFECTS: The most  common side effect is hot flashes (20%). Breast tenderness, headaches, nausea, bloating may also occur at different times. Less than 3/1,000 people have dryness or loss of hair. Persistent ovarian cysts may form from the use of this medication. Ovarian hyperstimulation syndrome is a rare side effect at low doses. Visual changes like flashes of light or blurring.      

## 2018-09-14 NOTE — Progress Notes (Signed)
Pt is present to discuss infertility issues.

## 2018-09-15 ENCOUNTER — Telehealth: Payer: Self-pay | Admitting: Obstetrics and Gynecology

## 2018-09-15 NOTE — Telephone Encounter (Signed)
Melissa from [facility] called and stated that she needs to speak with Dr. Marcelline Mates or her nurse in regards to the prescription metFORMIN (GLUCOPHAGE) 500 MG tablet [[id number]], that has some "Incorrectf instructions". Pharmacist is requesting a call back. Please advise.

## 2018-09-16 ENCOUNTER — Other Ambulatory Visit: Payer: Self-pay

## 2018-09-16 MED ORDER — METFORMIN HCL 500 MG PO TABS: ORAL_TABLET | ORAL | 5 refills | Status: DC

## 2018-09-16 NOTE — Telephone Encounter (Signed)
Spoke with the pharmacy to informed them that I had made the changes with the medication.

## 2018-10-05 ENCOUNTER — Telehealth: Payer: Self-pay | Admitting: Obstetrics and Gynecology

## 2018-10-05 NOTE — Telephone Encounter (Signed)
The patient called and stated that she needs to speak with a nurse in regards to knowing when to take her prescribed medications, Pt also has some concerns and questions in regards to medication management. Please advise.

## 2018-10-15 NOTE — Telephone Encounter (Signed)
Pt called no answer LM via VM to contact the office.  

## 2018-10-20 NOTE — Telephone Encounter (Signed)
Spoke with pt about her medication and other issues she was having. Pt stated that she was taking 2 different type of over the counter medication for libido. Pt was advised to not take 2 both of them at the same time. Pt was advised to try one of them to see if it helps and if it do not try the other one.

## 2018-11-15 ENCOUNTER — Telehealth: Payer: Self-pay | Admitting: Obstetrics and Gynecology

## 2018-11-15 NOTE — Telephone Encounter (Signed)
The patient called and stated that she has a yeast infection and over the counter medication is not working. The patient is requesting a prescription be sent into her pharmacy. Please advise.

## 2018-11-17 MED ORDER — FLUCONAZOLE 150 MG PO TABS: 150.0000 mg | ORAL_TABLET | Freq: Once | ORAL | 0 refills | Status: AC

## 2018-11-17 NOTE — Telephone Encounter (Signed)
Spoke with pt she stated having burning, itching, irritation, sore, and thick white discharge. Pt stated trying several OTC medication like monistat, yeast infection tablets and baking soda baths and nothing is clearing it up. Pt was prescribed diflucan 150 mg one time dose. Pt was advised that if her symptoms do not improve in 7 days after taking the first dose to please call the office and make an appointment to be seen by [facility].

## 2018-11-19 ENCOUNTER — Telehealth: Payer: Self-pay | Admitting: Obstetrics and Gynecology

## 2018-11-19 NOTE — Telephone Encounter (Signed)
The patient called and stated that she had a "False positive" pregnancy test that had a very faint line, and she is also now experiencing pink discharge with small amounts of stringy blood. The patient is requesting a call back from her nurse today if possible to discuss her concerns. Please advise.

## 2018-11-22 NOTE — Telephone Encounter (Signed)
Pt called to see have she started her cycle yet. Pt stated that her cycle came on that evening after her call to the office.

## 2018-12-14 ENCOUNTER — Ambulatory Visit (INDEPENDENT_AMBULATORY_CARE_PROVIDER_SITE_OTHER): Payer: Self-pay | Admitting: Obstetrics and Gynecology

## 2018-12-14 ENCOUNTER — Encounter: Payer: Self-pay | Admitting: Obstetrics and Gynecology

## 2018-12-14 ENCOUNTER — Other Ambulatory Visit: Payer: Self-pay

## 2018-12-14 VITALS — BP 116/80 | HR 84 | Ht 61.0 in | Wt 198.1 lb

## 2018-12-14 DIAGNOSIS — Z319 Encounter for procreative management, unspecified: Secondary | ICD-10-CM

## 2018-12-14 MED ORDER — CLOMIPHENE CITRATE 50 MG PO TABS: 100.0000 mg | ORAL_TABLET | Freq: Every day | ORAL | 3 refills | Status: DC

## 2018-12-14 NOTE — Patient Instructions (Signed)
Hysterosalpingography  Hysterosalpingography is a procedure in which a woman's uterus and fallopian tubes are examined. During this procedure, contrast dye is injected into the uterus through the vagina and cervix. X-rays are then taken. The dye makes the uterus and fallopian tubes show up clearly on the X-rays. This procedure may be done:  To help determine whether there are tumors, scars (adhesions), or other abnormalities in the uterus.  To find out why a woman is unable to have children (infertile).  To make sure the fallopian tubes are completely blocked a few months after having certain tubal sterilization procedures. Tell a health care provider about:  Any allergies you have.  All medicines you are taking, including vitamins, herbs, eye drops, creams, and over-the-counter medicines.  Any problems you or family members have had with the use of anesthetic medicines.  Any blood disorders you have.  Any surgeries you have had.  Any medical conditions you have.  Whether you are pregnant or may be pregnant. What are the risks? Generally, this is a safe procedure. However, problems may occur, including:  Infection in the lining of the uterus (endometritis) or fallopian tubes (salpingitis).  Allergic reaction to medicines or dyes.  Risk of making a hole (perforation) in the uterus or fallopian tubes.  Damage to other structures or organs. What happens before the procedure?  Schedule the procedure after your menstrual period stops, but before your next ovulation. This is usually between day 5 and day 10 of your last period. Day 1 is the first day of your period.  Ask your health care provider about changing or stopping your regular medicines. This is especially important if you are taking diabetes medicines or blood thinners.  Empty your bladder before the procedure begins.  Plan to have someone take you home from the hospital or clinic. What happens during the procedure?   You may be given one of the following: ? A medicine to help you relax (sedative). ? An over-the-counter pain medicine.  You will lie down on your back and place your feet into footrests (stirrups).  A device called a speculum will be inserted into your vagina. This allows your health care provider to see inside your vagina through to your cervix.  Your cervix will be washed with a germ-killing soap.  A medicine may be injected into your cervix to numb it (local anesthesia).  A thin, flexible tube will be passed through your cervix into your uterus.  Contrast dye will be passed through the tube and into the uterus. Contrast dye may cause some cramping.  Several X-rays will be taken as the contrast dye spreads through the uterus and into the fallopian tubes.  The tube will be removed. The contrast dye will flow out through your vagina naturally. The procedure may vary among health care providers and hospitals. What happens after the procedure?  Most of the contrast dye will flow out of your vagina naturally. You may want to wear a sanitary pad.  You may have mild cramping and vaginal bleeding. This should go away after a short time.  Do not drive for 24 hours if you were given a sedative.  It is up to you to get the results of your procedure. Ask your health care provider, or the department that is doing the procedure, when your results will be ready. Summary  Hysterosalpingography is a procedure in which a woman's uterus and fallopian tubes are examined.  During this procedure, contrast dye is injected into the uterus   through the vagina and cervix. X-rays are then taken. The dye helps the uterus and fallopian tubes show up clearly on the X-rays.  Schedule the procedure after your menstrual period stops, but before your next ovulation. This is usually between day 5 and day 10 of your last period.  After the procedure, you may have mild cramping and vaginal bleeding. This should go  away after a short time. This information is not intended to replace advice given to you by your health care provider. Make sure you discuss any questions you have with your health care provider. Document Released: 03/22/2004 Document Revised: 01/30/2017 Document Reviewed: 03/12/2016 Elsevier Patient Education  2020 Elsevier Inc.  

## 2018-12-14 NOTE — Progress Notes (Signed)
Pt present for 3 month follow up for infertility issues. Pt stated that she is doing well.

## 2018-12-14 NOTE — Progress Notes (Signed)
    GYNECOLOGY PROGRESS NOTE  Subjective:    Patient ID: Jaime Harvey, female    DOB: [birth year], 28 y.o.   MRN: [id number]  HPI  Patient is a 28 y.o. G0P0000 female who presents for f/u of primary fertility management. She has been on Clomid and Metformin regimen for 2 months. Notes that she has seen a slight change in her discharge where she is noting more mucous.  Also notes that her menstrual cycle itself was a little different last mont (was lighter and saw "stringy material").  Patient states that she can sometimes feel her ovulation now, citing crampy unilateral pain during the middle of the month.    The following portions of the patient's history were reviewed and updated as appropriate: allergies, current medications, past family history, past medical history, past social history, past surgical history and problem list.  Review of Systems Pertinent items noted in HPI and remainder of comprehensive ROS otherwise negative.   Objective:   Blood pressure 116/80, pulse 84, height 5\' 1"  (1.549 m), weight 198 lb 1.6 oz (89.9 kg), last menstrual period 11/19/2018. General appearance: alert and no distress Abdomen: soft, non-tender; bowel sounds normal; no masses,  no organomegaly Remainder of exam deferred.   Assessment:   Infertility management   Plan:   - Will order Day 21 progesterone (although patient is right now Day 24).   - Continue Clomid, will increase to 100 mg dosing after third cycle of 50 mg. To also continue Metformin.  - Will schedule for HSG to assess tubal patency in 2 weeks. Has had no prior risk factors (no h/o STDs/PID, endometriosis, ectopic pregnancy, or pelvic surgeries), however if no success so far with conception, will evaluate further. Partner has already had testing performed.    Rubie Maid, MD [facility]

## 2018-12-20 ENCOUNTER — Telehealth: Payer: Self-pay | Admitting: Obstetrics and Gynecology

## 2018-12-20 NOTE — Telephone Encounter (Signed)
Patient called stating she is having a reaction to metformin, she states is gives her constant diarrhea. She states she stopped taking the medication yesterday 10/18. Please Advise

## 2018-12-23 NOTE — Telephone Encounter (Signed)
Pt called no answer and no vm pick up.

## 2018-12-27 ENCOUNTER — Ambulatory Visit
Admission: RE | Admit: 2018-12-27 | Discharge: 2018-12-27 | Disposition: A | Discharge status: Home / Self Care | Payer: Medicaid Other | Source: Ambulatory Visit | Attending: Obstetrics and Gynecology | Admitting: Obstetrics and Gynecology

## 2018-12-27 ENCOUNTER — Other Ambulatory Visit: Payer: Self-pay

## 2018-12-27 DIAGNOSIS — N979 Female infertility, unspecified: Secondary | ICD-10-CM

## 2018-12-27 DIAGNOSIS — Z319 Encounter for procreative management, unspecified: Secondary | ICD-10-CM | POA: Insufficient documentation

## 2018-12-27 MED ORDER — IOHEXOL 180 MG/ML  SOLN
50.0000 mL | Freq: Once | INTRAMUSCULAR | Status: AC | PRN
  Administered 2018-12-27: 25 mL

## 2018-12-27 NOTE — Telephone Encounter (Signed)
Spoke with pt and she stated that the metformin has been given her really bad stomach cramps and diarrhea. Pt stated that she spoke to the pharmacy and gave her directions on how to take the medication properly. Pt stated that she has been feeling a lot better since then.

## 2018-12-28 ENCOUNTER — Telehealth: Payer: Self-pay

## 2018-12-28 NOTE — Telephone Encounter (Signed)
Pt called to state she is having diarrhea that began Monday night. Pt states frommedication. Please advise and return call to pt.

## 2018-12-30 NOTE — Telephone Encounter (Signed)
Pt called no answer LM via VM to call the office to speak more about her issues with her metformin.

## 2019-01-05 ENCOUNTER — Other Ambulatory Visit: Payer: Self-pay

## 2019-01-05 ENCOUNTER — Encounter: Payer: Self-pay | Admitting: Obstetrics and Gynecology

## 2019-01-05 ENCOUNTER — Ambulatory Visit (INDEPENDENT_AMBULATORY_CARE_PROVIDER_SITE_OTHER): Payer: Self-pay | Admitting: Obstetrics and Gynecology

## 2019-01-05 VITALS — BP 103/71 | HR 80 | Ht 61.0 in | Wt 197.2 lb

## 2019-01-05 DIAGNOSIS — R638 Other symptoms and signs concerning food and fluid intake: Secondary | ICD-10-CM

## 2019-01-05 DIAGNOSIS — N979 Female infertility, unspecified: Secondary | ICD-10-CM

## 2019-01-05 NOTE — Progress Notes (Signed)
    GYNECOLOGY PROGRESS NOTE  Subjective:    Patient ID: Jaime Harvey, female    DOB: 10-22-1990, 28 y.o.   MRN: [id number]  HPI  Patient is a 28 y.o. G0P0000 female who presents for review of test results. Patient underwent an HSG procedure for evaluation of fertility last week. Notes cramping on the first day, but otherwise doing well.  Just started on high dose Clomid regimen (changed from 50 mg to 100 mg this month).  Notes she is still doing her ovulation kits, which noted she did ovulate this month.   The following portions of the patient's history were reviewed and updated as appropriate: allergies, current medications, past family history, past medical history, past social history, past surgical history and problem list.  Review of Systems Pertinent items noted in HPI and remainder of comprehensive ROS otherwise negative.   Objective:   Blood pressure 103/71, pulse 80, height 5\' 1"  (1.549 m), weight 197 lb 3.2 oz (89.4 kg), last menstrual period 12/17/2018. General appearance: alert and no distress Remainder of exam deferred.     Imaging:  DG Hysterogram (HSG) Addendum: ADDENDUM REPORT: 12/27/2018 14:27   ADDENDUM:  Addendum is created as the incorrect technique was included in the  original dictation. The technique section should read:  "Hysterosalpingogram was performed by OB/Gyn physician and  fluoroscopy was provided by the radiology department."   Please see the performing physicians note, Dr. Marcelline Mates, for further  procedural detail.   Electronically Signed    By: Davina Poke M.D.    On: 12/27/2018 14:27 Narrative: CLINICAL DATA:  Infertility  EXAM: HYSTEROSALPINGOGRAM  TECHNIQUE: Following cleansing of the cervix and vagina with Betadine solution, a hysterosalpingogram was performed using a 5-French hysterosalpingogram catheter and Omnipaque 300 contrast. The patient tolerated the examination without difficulty.  COMPARISON:  Pelvic ultrasound  03/04/2018.  FLUOROSCOPY TIME:  Radiation Exposure Index (as provided by the fluoroscopic device): 5.9 mGy  Fluoroscopy Time:  24 seconds  Number of Acquired Images:  1 screen capture, 0 full exposures  FINDINGS: Endometrial cavity is normal. Both fallopian tubes fill and have a normal appearance. Normal spillage bilaterally.  IMPRESSION: Unremarkable hysterosalpingogram with patent fallopian tubes bilaterally.  Electronically Signed: By: Davina Poke M.D. On: 12/27/2018 14:17   Assessment:   Primary infertility  Plan:   - Patient has now completed workup for infertility, both her and partner with no significant findings. Can continue on Clomid for assistance.  - Also discussed weight management, exercising, and diet for helping to improve fertility.  - RTC in 3 months if no pregnancy on new dosing of Clomid.    Rubie Maid, MD [facility]

## 2019-01-05 NOTE — Progress Notes (Signed)
Pt is present for a follow up to discuss results. Pt stated that she was doing well. LMP 12/17/18.

## 2019-02-07 HISTORY — PX: HYSTEROSALPINGOGRAM: SHX6581

## 2019-02-07 NOTE — Procedures (Signed)
Hysterosalpingogram Post-Procedure Note  Pre-procedure Diagnosis: Primary Infertility, female;   Post-operative Diagnosis: Same  Indications: Infertility   Procedure Details:   Consent: Informed consent was obtained. Risks of the procedure were discussed including: infection, bleeding, pain, and allergic reaction to dye.  A speculum was inserted into the patient's vagina.  The cervix was cleansed with Betadine and a single-toothed tenaculum was used to grasp the anterior lip of the cervix.  The cervical opening was cannulated per standard procedure.  Then under fluoroscopic guidance, water soluble contrast was injected in a retrograde fashion. Spillage was noted from bilateral fallopian tubes after approximately 4 seconds.  X-ray imaging captured per the Radiologist (Dr. Davina Poke ). The cannula was then removed from the uterine cavity.  The tenaculum was removed from the cervix with good hemostasis noted, and the speculum was removed.  The patient tolerated the procedure well.    Findings:   The uterus fills normally, with no evidence of contour abnormality, filling defect, septum, mass, or bicornuate configuration.   The bilateral fallopian tubes are normal and patent with normal rapid spillage of contrast into the peritoneum.   Fluoroscopy time: 24 seconds.   Complications: None     Condition: Stable  Plan: Patient to f/u in clinic in 1 week for further management of infertility.     Rubie Maid, MD [facility]

## 2019-02-07 NOTE — Addendum Note (Signed)
Encounter addended by: Rubie Maid, MD on: 02/07/2019 2:20 PM  Actions taken: Results reviewed in IB, Clinical Note Signed, Charge Capture section accepted

## 2019-04-01 ENCOUNTER — Emergency Department
Admission: EM | Admit: 2019-04-01 | Discharge: 2019-04-01 | Disposition: A | Discharge status: Home / Self Care | Payer: HRSA Program | Attending: Emergency Medicine | Admitting: Emergency Medicine

## 2019-04-01 ENCOUNTER — Other Ambulatory Visit: Payer: Self-pay

## 2019-04-01 ENCOUNTER — Encounter: Payer: Self-pay | Admitting: Emergency Medicine

## 2019-04-01 DIAGNOSIS — J069 Acute upper respiratory infection, unspecified: Secondary | ICD-10-CM

## 2019-04-01 DIAGNOSIS — R509 Fever, unspecified: Secondary | ICD-10-CM | POA: Diagnosis present

## 2019-04-01 DIAGNOSIS — U071 COVID-19: Secondary | ICD-10-CM | POA: Diagnosis not present

## 2019-04-01 LAB — SARS CORONAVIRUS 2 (TAT 6-24 HRS): SARS Coronavirus 2: POSITIVE — AB

## 2019-04-01 NOTE — Discharge Instructions (Signed)
Follow-up with your primary care provider if any continued problems or concerns.  Also a Covid test was done today.  You will need to quarantine until you have heard the results of your test which will be available tomorrow.  You may see your results on my chart.  Should your Covid test be positive you will need to quarantine for an additional 10 days.  Tylenol or ibuprofen as needed for fever, headache or body aches.  Increase fluids to stay hydrated.  Return to the emergency department if any severe worsening of your symptoms such as shortness of breath or difficulty breathing.

## 2019-04-01 NOTE — ED Notes (Signed)
See triage note  Presents with body aches and "just feeling bad" since yesterday  States temp was 100.4 last pm  Afebrile on arrival

## 2019-04-01 NOTE — ED Triage Notes (Signed)
C/O fever, body aches and mucous in throat.  Tylenol 500 mg taken just PTA.

## 2019-04-01 NOTE — ED Notes (Signed)
Lab called with a positive covid result. Attempted to notify patient. Voice mail left for patient to call back to the ED.

## 2019-04-01 NOTE — ED Provider Notes (Signed)
[facility] Emergency Department Provider Note   ____________________________________________   First MD Initiated Contact with Patient 04/01/19 [phone]     (approximate)  I have reviewed the triage vital signs and the nursing notes.   HISTORY  Chief Complaint Fever and Generalized Body Aches   HPI Jaime Harvey is a 29 y.o. female presents to the ED with complaint of body aches, fever, chills that began last evening.  Patient states that her husband gave her either Tylenol or ibuprofen she is not exactly sure.  She states that she also had a nonproductive cough.  She denies any nausea, vomiting or diarrhea.  No change in taste or smell.  Patient denies any known Covid exposure.  She rates her pain as 5 out of 10.       Past Medical History:  Diagnosis Date  . No known health problems     Patient Active Problem List   Diagnosis Date Noted  . H/O infertility 05/20/2018  . Cyst of left ovary 05/20/2018    Past Surgical History:  Procedure Laterality Date  . HYSTEROSALPINGOGRAM  02/07/2019      . NO PAST SURGERIES      Prior to Admission medications   Medication Sig Start Date End Date Taking? Authorizing Provider  clomiPHENE (CLOMID) 50 MG tablet Take 2 tablets (100 mg total) by mouth daily. Take on days 5-9 of your period 12/14/18   Rubie Maid, MD  metFORMIN (GLUCOPHAGE) 500 MG tablet Take one tablet by mouth daily for one week. Then increase to one tablet twice a day. 09/16/18   Rubie Maid, MD    Allergies Tramadol  Family History  Problem Relation Age of Onset  . Healthy Mother   . Diabetes Maternal Grandmother   . Hypertension Maternal Grandmother   . Healthy Father   . Hypotension Maternal Grandfather   . Diabetes Maternal Grandfather     Social History Social History   Tobacco Use  . Smoking status: Never Smoker  . Smokeless tobacco: Never Used  Substance Use Topics  . Alcohol use: Not Currently  . Drug use: No      Review of Systems Constitutional: Positive fever/chills Eyes: No visual changes. ENT: No sore throat. Cardiovascular: Denies chest pain. Respiratory: Denies shortness of breath. Gastrointestinal: No abdominal pain.  No nausea, no vomiting.  No diarrhea.  Musculoskeletal: Positive for muscle aches. Skin: Negative for rash. Neurological: Positive for generalized headache, no focal weakness or numbness. ___________________________________________   PHYSICAL EXAM:  VITAL SIGNS: ED Triage Vitals  Enc Vitals Group     BP 04/01/19 0831 (!) 116/55     Pulse Rate 04/01/19 0831 (!) 109     Resp 04/01/19 0831 18     Temp 04/01/19 0831 98.4 F (36.9 C)     Temp Source 04/01/19 0831 Oral     SpO2 04/01/19 0831 100 %     Weight 04/01/19 0829 197 lb 1.5 oz (89.4 kg)     Height --      Head Circumference --      Peak Flow --      Pain Score 04/01/19 0828 5     Pain Loc --      Pain Edu? --      Excl. in [place]? --    Constitutional: Alert and oriented. Well appearing and in no acute distress. Eyes: Conjunctivae are normal.  Head: Atraumatic. Nose: No congestion/rhinnorhea. Neck: No stridor.   Cardiovascular: Normal rate, regular rhythm. Grossly  normal heart sounds.  Good peripheral circulation. Respiratory: Normal respiratory effort.  No retractions. Lungs CTAB. Musculoskeletal: Moves upper and lower extremities any difficulty normal gait was noted. Neurologic:  Normal speech and language. No gross focal neurologic deficits are appreciated. No gait instability. Skin:  Skin is warm, dry and intact. No rash noted. Psychiatric: Mood and affect are normal. Speech and behavior are normal.  ____________________________________________   LABS (all labs ordered are listed, but only abnormal results are displayed)  Labs Reviewed  SARS CORONAVIRUS 2 (TAT 6-24 HRS)    PROCEDURES  Procedure(s) performed (including Critical  Care):  Procedures   ____________________________________________   INITIAL IMPRESSION / ASSESSMENT AND PLAN / ED COURSE  As part of my medical decision making, I reviewed the following data within the electronic MEDICAL RECORD NUMBER Notes from prior ED visits and [place] Controlled Substance Database  28 year old female presents to the ED with complaint of body aches, fever, chills and generalized headache that began last evening.  Patient states she also has a mild cough.  She denies any known exposure to Covid.  She states that her boss will not let her come back to work until she has had a Covid test.  Patient is afebrile at present with an O2 sat of 100%.  Covid test was done and patient is aware that she needs to quarantine until she is received the results.  Note was written for her to have proof that she was here at work.  She is also aware that if it is positive she will need to quarantine an additional 10 days.  ____________________________________________   FINAL CLINICAL IMPRESSION(S) / ED DIAGNOSES  Final diagnoses:  Viral upper respiratory tract infection with cough     ED Discharge Orders    None       Note:  This document was prepared using Dragon voice recognition software and may include unintentional dictation errors.    Tommi Rumps, PA-C 04/01/19 [id number]    Charlynne Pander, MD 04/02/19 [phone]

## 2019-04-02 ENCOUNTER — Other Ambulatory Visit: Payer: Self-pay | Admitting: Unknown Physician Specialty

## 2019-04-02 ENCOUNTER — Telehealth: Payer: Self-pay | Admitting: Unknown Physician Specialty

## 2019-04-02 DIAGNOSIS — U071 COVID-19: Secondary | ICD-10-CM

## 2019-04-02 NOTE — Telephone Encounter (Signed)
Called to discuss with patient about Covid symptoms and the use of bamlanivimab, a monoclonal antibody infusion for those with mild to moderate Covid symptoms and at a high risk of hospitalization.  Pt is qualified for this infusion at the [facility] due to BMI>35   Message left to call back  

## 2019-04-02 NOTE — ED Notes (Signed)
Patient returned phone call. Patient notified of positive covid results. Patient informed that she needs to quarantine and that the Health Department would be following up with her. Patient verbalized understanding.

## 2019-04-02 NOTE — Telephone Encounter (Signed)
  I connected by phone with Jaime Harvey on 04/02/2019 at 9:07 AM to discuss the potential use of an new treatment for mild to moderate COVID-19 viral infection in non-hospitalized patients.  This patient is a 29 y.o. female that meets the FDA criteria for Emergency Use Authorization of bamlanivimab or casirivimab\imdevimab.  Has a (+) direct SARS-CoV-2 viral test result  Has mild or moderate COVID-19   Is ? 28 years of age and weighs ? 40 kg  Is NOT hospitalized due to COVID-19  Is NOT requiring oxygen therapy or requiring an increase in baseline oxygen flow rate due to COVID-19  Is within 10 days of symptom onset  Has at least one of the high risk factor(s) for progression to severe COVID-19 and/or hospitalization as defined in EUA.  Specific high risk criteria : BMI >/= 35   I have spoken and communicated the following to the patient or parent/caregiver:  1. FDA has authorized the emergency use of bamlanivimab and casirivimab\imdevimab for the treatment of mild to moderate COVID-19 in adults and pediatric patients with positive results of direct SARS-CoV-2 viral testing who are 20 years of age and older weighing at least 40 kg, and who are at high risk for progressing to severe COVID-19 and/or hospitalization.  2. The significant known and potential risks and benefits of bamlanivimab and casirivimab\imdevimab, and the extent to which such potential risks and benefits are unknown.  3. Information on available alternative treatments and the risks and benefits of those alternatives, including clinical trials.  4. Patients treated with bamlanivimab and casirivimab\imdevimab should continue to self-isolate and use infection control measures (e.g., wear mask, isolate, social distance, avoid sharing personal items, clean and disinfect "high touch" surfaces, and frequent handwashing) according to CDC guidelines.   5. The patient or parent/caregiver has the option to accept or refuse  bamlanivimab or casirivimab\imdevimab .  After reviewing this information with the patient, The patient agreed to proceed with receiving the bamlanimivab infusion and will be provided a copy of the Fact sheet prior to receiving the infusion.[name] 04/02/2019 9:07 AM   Sx onset 1/28

## 2019-04-04 ENCOUNTER — Ambulatory Visit (HOSPITAL_COMMUNITY)
Admission: RE | Admit: 2019-04-04 | Discharge: 2019-04-04 | Disposition: A | Discharge status: Home / Self Care | Payer: Medicaid Other | Source: Ambulatory Visit | Attending: Pulmonary Disease | Admitting: Pulmonary Disease

## 2019-04-04 DIAGNOSIS — U071 COVID-19: Secondary | ICD-10-CM | POA: Diagnosis present

## 2019-04-04 DIAGNOSIS — Z23 Encounter for immunization: Secondary | ICD-10-CM | POA: Insufficient documentation

## 2019-04-04 MED ORDER — ALBUTEROL SULFATE HFA 108 (90 BASE) MCG/ACT IN AERS: 2.0000 | INHALATION_SPRAY | Freq: Once | RESPIRATORY_TRACT | Status: DC | PRN

## 2019-04-04 MED ORDER — SODIUM CHLORIDE 0.9 % IV SOLN
INTRAVENOUS | Status: DC | PRN
  Administered 2019-04-04: 250 mL via INTRAVENOUS

## 2019-04-04 MED ORDER — EPINEPHRINE 0.3 MG/0.3ML IJ SOAJ: 0.3000 mg | Freq: Once | INTRAMUSCULAR | Status: DC | PRN

## 2019-04-04 MED ORDER — DIPHENHYDRAMINE HCL 50 MG/ML IJ SOLN: 50.0000 mg | Freq: Once | INTRAMUSCULAR | Status: DC | PRN

## 2019-04-04 MED ORDER — FAMOTIDINE IN NACL 20-0.9 MG/50ML-% IV SOLN: 20.0000 mg | Freq: Once | INTRAVENOUS | Status: DC | PRN

## 2019-04-04 MED ORDER — SODIUM CHLORIDE 0.9 % IV SOLN
700.0000 mg | Freq: Once | INTRAVENOUS | Status: AC
  Administered 2019-04-04: 13:00:00 700 mg via INTRAVENOUS
  Filled 2019-04-04: qty 20

## 2019-04-04 MED ORDER — METHYLPREDNISOLONE SODIUM SUCC 125 MG IJ SOLR: 125.0000 mg | Freq: Once | INTRAMUSCULAR | Status: DC | PRN

## 2019-04-04 NOTE — Progress Notes (Signed)
  Diagnosis: COVID-19  Physician:Dr.Wright  Procedure: Covid Infusion Clinic Med: bamlanivimab infusion - Provided patient with bamlanimivab fact sheet for patients, parents and caregivers prior to infusion.  Complications: No immediate complications noted.  Discharge: Discharged home   Chezney Huether  Bell 04/04/2019   

## 2019-04-04 NOTE — Discharge Instructions (Signed)
10 Things You Can Do to Manage Your COVID-19 Symptoms at Home If you have possible or confirmed COVID-19: 1. Stay home from work and school. And stay away from other public places. If you must go out, avoid using any kind of public transportation, ridesharing, or taxis. 2. Monitor your symptoms carefully. If your symptoms get worse, call your healthcare provider immediately. 3. Get rest and stay hydrated. 4. If you have a medical appointment, call the healthcare provider ahead of time and tell them that you have or may have COVID-19. 5. For medical emergencies, call 911 and notify the dispatch personnel that you have or may have COVID-19. 6. Cover your cough and sneezes with a tissue or use the inside of your elbow. 7. Wash your hands often with soap and water for at least 20 seconds or clean your hands with an alcohol-based hand sanitizer that contains at least 60% alcohol. 8. As much as possible, stay in a specific room and away from other people in your home. Also, you should use a separate bathroom, if available. If you need to be around other people in or outside of the home, wear a mask. 9. Avoid sharing personal items with other people in your household, like dishes, towels, and bedding. 10. Clean all surfaces that are touched often, like counters, tabletops, and doorknobs. Use household cleaning sprays or wipes according to the label instructions. cdc.gov/coronavirus 09/01/2018 This information is not intended to replace advice given to you by your health care provider. Make sure you discuss any questions you have with your health care provider. Document Revised: 02/03/2019 Document Reviewed: 02/03/2019 Elsevier Patient Education  2020 Elsevier Inc.  

## 2019-04-12 ENCOUNTER — Telehealth: Payer: Self-pay | Admitting: Obstetrics and Gynecology

## 2019-04-12 NOTE — Telephone Encounter (Signed)
Pt called in and stated that the metformin is making her sick and she was advise to call if it did. The pt is having diarrhea. The pt is requesting a lower dose if possible. Please advise

## 2019-04-18 NOTE — Telephone Encounter (Signed)
Pt called to speak about her call to the office. Pt stated that she stopped taking the Metformin due to having diarrhea after taking the medication. Pt is requesting a decrease in the dose of the medication to decrease diarrhea.

## 2019-04-20 ENCOUNTER — Telehealth: Payer: Self-pay

## 2019-04-20 NOTE — Telephone Encounter (Signed)
Pt was called to see how many metformin she was taking a day. Pt stated that she was taking one tablet twice daily and then went to one tablet a day due to having diarrhea. Pt stated that even on the one tablet a day she is still having diarrhea. Pt is currently not taking the metformin at this time due to the diarrhea issues.

## 2019-08-30 ENCOUNTER — Telehealth: Payer: Self-pay | Admitting: Obstetrics and Gynecology

## 2019-08-30 NOTE — Telephone Encounter (Signed)
Patient called in stating she needed a refill on her clomid. Could you please advise?

## 2019-09-01 NOTE — Telephone Encounter (Signed)
Please see my chart messages

## 2019-09-20 ENCOUNTER — Ambulatory Visit: Payer: Medicaid Other | Admitting: Obstetrics and Gynecology

## 2019-10-18 ENCOUNTER — Ambulatory Visit (INDEPENDENT_AMBULATORY_CARE_PROVIDER_SITE_OTHER): Payer: Self-pay | Admitting: Obstetrics and Gynecology

## 2019-10-18 ENCOUNTER — Encounter: Payer: Self-pay | Admitting: Obstetrics and Gynecology

## 2019-10-18 ENCOUNTER — Other Ambulatory Visit: Payer: Self-pay

## 2019-10-18 VITALS — BP 118/80 | HR 88 | Ht 61.0 in | Wt 205.5 lb

## 2019-10-18 DIAGNOSIS — N979 Female infertility, unspecified: Secondary | ICD-10-CM

## 2019-10-18 DIAGNOSIS — K2100 Gastro-esophageal reflux disease with esophagitis, without bleeding: Secondary | ICD-10-CM

## 2019-10-18 MED ORDER — LETROZOLE 2.5 MG PO TABS: ORAL_TABLET | ORAL | 0 refills | Status: DC

## 2019-10-18 NOTE — Progress Notes (Signed)
GYNECOLOGY PROGRESS NOTE  Subjective:    Patient ID: Jaime Harvey, female    DOB: January 09, 1991, 29 y.o.   MRN: [id number]  HPI  Patient is a 29 y.o. G0P0000 female who presents for f/u of primary infertility.  She has been using Clomid for ovulation induction for the past 6 months (3 months on low-dose and 3 months on high-dose regimen) without atrial flutter tachycardia without success.  She has not used any medication this month as her prescription has run out.  She reports using ovulation kits and tracking her fertility on a phone app.  She also reports having sex almost daily or every other day throughout the month.  Partner has had semen analysis completed, patient reports that this was normal.  She does also informed that his hydrocele has improved since our last encounter.  Lastly she reports that she has discontinued the use of the Metformin due to side effects, however is now taking a fertility vitamin that she found online.  Partner does still smoke regularly  Additionally Jayani mentions that she has occasional issues with acid reflux.  She does take bismuth to help to relieve any discomfort.  She wonders if this could be causing inflammation that may negatively impact her fertility.   The following portions of the patient's history were reviewed and updated as appropriate: allergies, current medications, past family history, past medical history, past social history, past surgical history and problem list.  Review of Systems Pertinent items noted in HPI and remainder of comprehensive ROS otherwise negative.   Objective:   Blood pressure 118/80, pulse 88, height '5\' 1"'$  (1.549 m), weight 205 lb 8 oz (93.2 kg), last menstrual period 10/03/2019. General appearance: alert and no distress Remainder of exam deferred.    Assessment:   1. Primary female infertility   2. Gastroesophageal reflux disease with esophagitis without hemorrhage    Plan:   1.  Discussion had with  patient on her options.  Currently just completed a full round of Clomid for ovulation induction without success.  Has options to try ovulation induction with another medication (Femara).  Otherwise patient may need to see a reproductive specialist and consider IUI.  Patient notes that she does not think she has the financial ability to seek a specialist at this time, would like to continue with ovulation induction using from [place].  Has not used any medication during this month.  Is past the window.  Will resume ovulation induction medications starting next month.  Discussed risk benefits and side effects of medication.  Also continue to encourage patient to insist on smoking cessation of her partner to help with fertility.  He can also take fertility vitamins as well.  Reiterated purposeful coitus during ovulation week.  Patient states she took an ovulation kit and was noted to be ovulating today. 2.  Acid reflux -discussed conservative management options of reflux symptoms, including avoidance of spicy or acidic foods and drinks, can utilize home remedy treatments such as mustard or apple cider vinegar or milk.  Currently using bismuth to help with symptoms, as needed.  If symptoms continue to progress discussed use of over-the-counter medications such as Tums, Zantac, or Pepcid. 3.  Return to clinic in 4 months for further management of fertility.    A total of 15 minutes were spent face-to-face with the patient during this encounter and over half of that time dealt with counseling and coordination of care.   Rubie Maid, MD [facility]

## 2019-10-18 NOTE — Patient Instructions (Signed)
Letrozole tablets What is this medicine? LETROZOLE (LET roe zole) blocks the production of estrogen. It is used to treat breast cancer. This medicine may be used for other purposes; ask your health care provider or pharmacist if you have questions. COMMON BRAND NAME(S): Femara What should I tell my health care provider before I take this medicine? They need to know if you have any of these conditions:  high cholesterol  liver disease  osteoporosis (weak bones)  an unusual or allergic reaction to letrozole, other medicines, foods, dyes, or preservatives  pregnant or trying to get pregnant  breast-feeding How should I use this medicine? Take this medicine by mouth with a glass of water. You may take it with or without food. Follow the directions on the prescription label. Take your medicine at regular intervals. Do not take your medicine more often than directed. Do not stop taking except on your doctor's advice. Talk to your pediatrician regarding the use of this medicine in children. Special care may be needed. Overdosage: If you think you have taken too much of this medicine contact a poison control center or emergency room at once. NOTE: This medicine is only for you. Do not share this medicine with others. What if I miss a dose? If you miss a dose, take it as soon as you can. If it is almost time for your next dose, take only that dose. Do not take double or extra doses. What may interact with this medicine? Do not take this medicine with any of the following medications:  estrogens, like hormone replacement therapy or birth control pills This medicine may also interact with the following medications:  dietary supplements such as androstenedione or DHEA  prasterone  tamoxifen This list may not describe all possible interactions. Give your health care provider a list of all the medicines, herbs, non-prescription drugs, or dietary supplements you use. Also tell them if you  smoke, drink alcohol, or use illegal drugs. Some items may interact with your medicine. What should I watch for while using this medicine? Tell your doctor or healthcare professional if your symptoms do not start to get better or if they get worse. Do not become pregnant while taking this medicine or for 3 weeks after stopping it. Women should inform their doctor if they wish to become pregnant or think they might be pregnant. There is a potential for serious side effects to an unborn child. Talk to your health care professional or pharmacist for more information. Do not breast-feed while taking this medicine or for 3 weeks after stopping it. This medicine may interfere with the ability to have a child. Talk with your doctor or health care professional if you are concerned about your fertility. Using this medicine for a long time may increase your risk of low bone mass. Talk to your doctor about bone health. You may get drowsy or dizzy. Do not drive, use machinery, or do anything that needs mental alertness until you know how this medicine affects you. Do not stand or sit up quickly, especially if you are an older patient. This reduces the risk of dizzy or fainting spells. You may need blood work done while you are taking this medicine. What side effects may I notice from receiving this medicine? Side effects that you should report to your doctor or health care professional as soon as possible:  allergic reactions like skin rash, itching, or hives  bone fracture  chest pain  signs and symptoms of a blood clot   such as breathing problems; changes in vision; chest pain; severe, sudden headache; pain, swelling, warmth in the leg; trouble speaking; sudden numbness or weakness of the face, arm or leg  vaginal bleeding Side effects that usually do not require medical attention (report to your doctor or health care professional if they continue or are bothersome):  bone, back, joint, or muscle  pain  dizziness  fatigue  fluid retention  headache  hot flashes, night sweats  nausea  weight gain This list may not describe all possible side effects. Call your doctor for medical advice about side effects. You may report side effects to FDA at 1-800-FDA-1088. Where should I keep my medicine? Keep out of the reach of children. Store between 15 and 30 degrees C (59 and 86 degrees F). Throw away any unused medicine after the expiration date. NOTE: This sheet is a summary. It may not cover all possible information. If you have questions about this medicine, talk to your doctor, pharmacist, or health care provider.  2020 Elsevier/Gold Standard (2015-09-24 11:10:41)  

## 2019-10-18 NOTE — Progress Notes (Signed)
Pt present for medication refill and infertility issues. Pt stated that she was doing well no problems.

## 2019-12-28 IMAGING — DX DG HAND 2V*R*
2 series · 2 of 2 positions shown · non-contrast
Comparison: None.

CLINICAL DATA: Right hand laceration due to an injury suffered when
the patient's hand became caught in a mixer today. Initial
encounter.

EXAM:
RIGHT HAND - 2 VIEW

[hand ap]
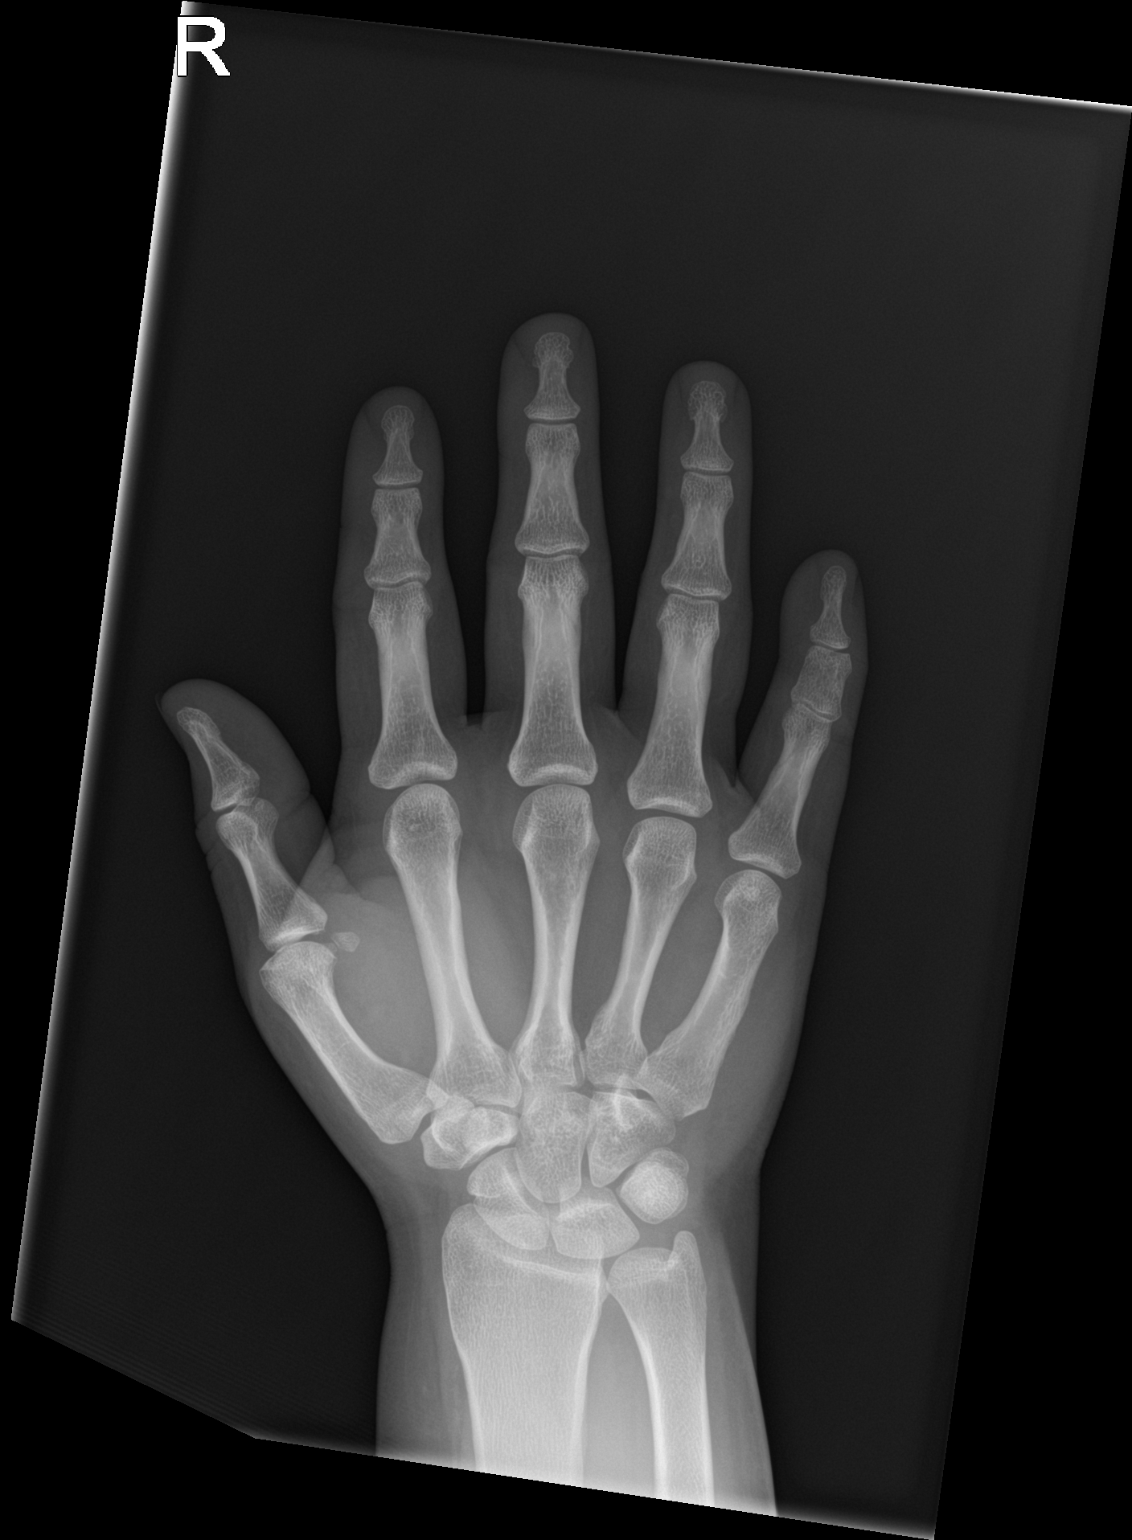

[hand lat]
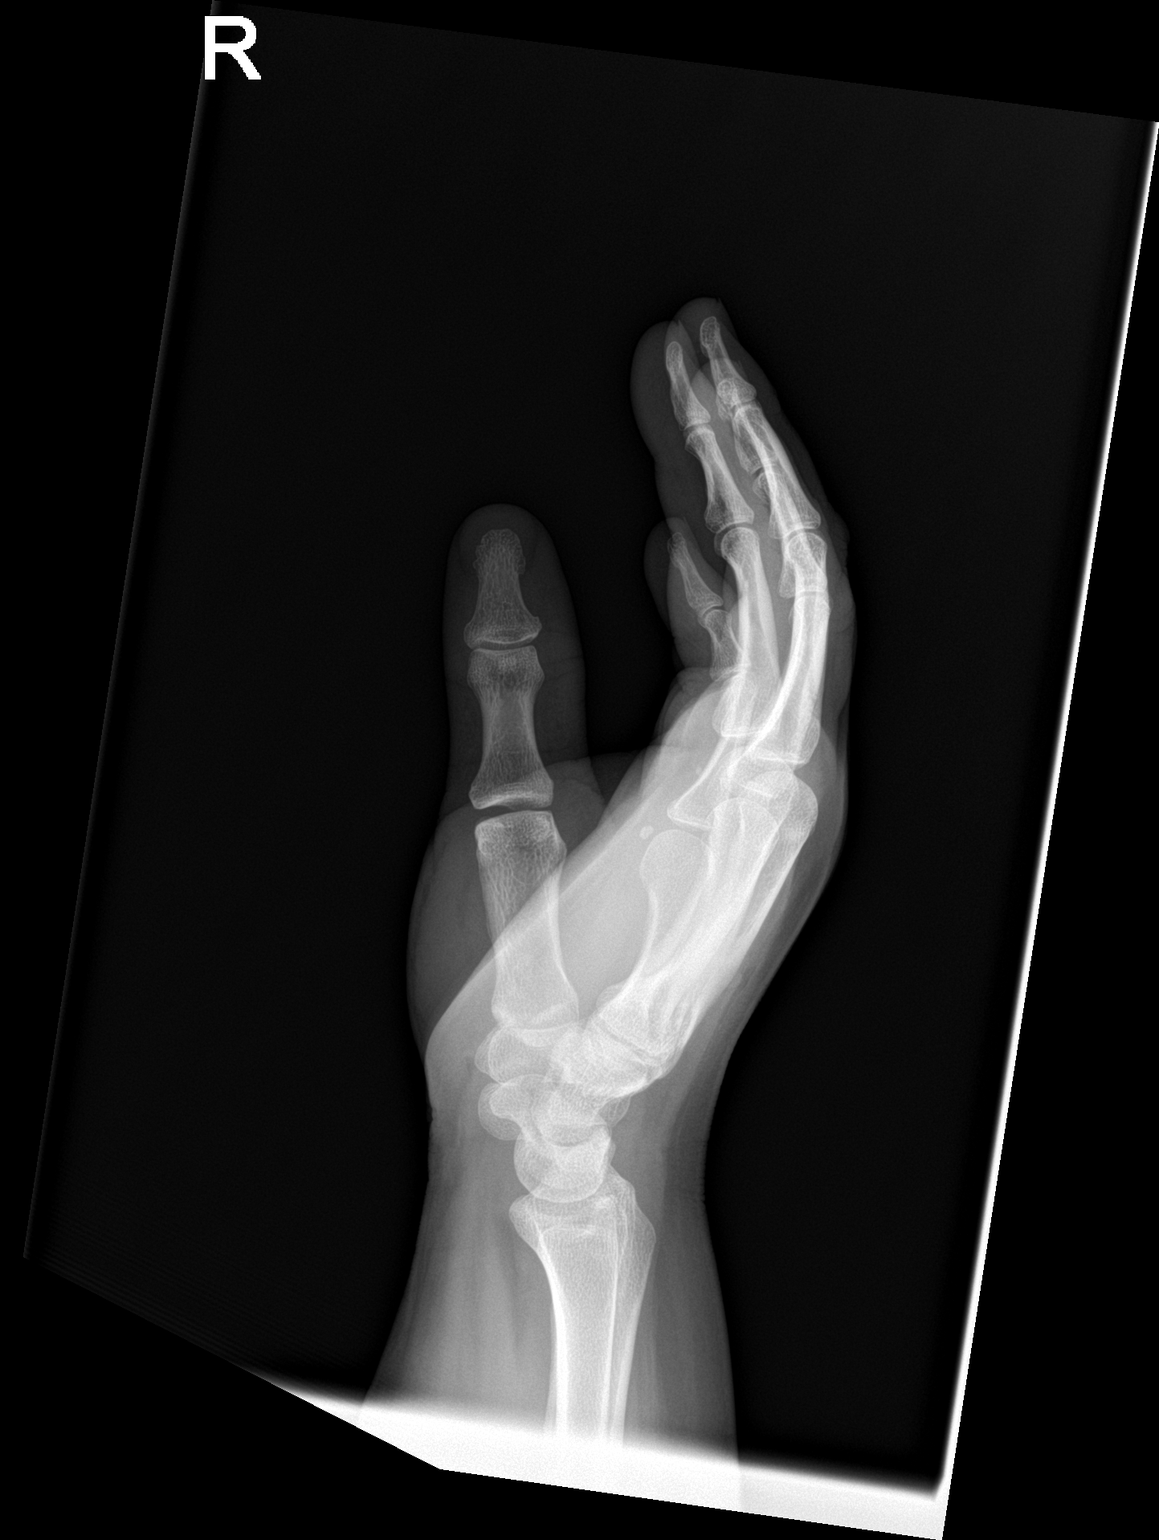

[2 of 2 positions shown; findings below may reference images not displayed]

FINDINGS: There is no evidence of fracture or dislocation. There is no
evidence of arthropathy or other focal bone abnormality. Soft
tissues are unremarkable.
IMPRESSION: Negative exam.

## 2020-01-19 ENCOUNTER — Encounter: Payer: Medicaid Other | Admitting: Obstetrics and Gynecology

## 2020-01-20 ENCOUNTER — Encounter: Payer: Medicaid Other | Admitting: Obstetrics and Gynecology

## 2020-01-24 ENCOUNTER — Encounter: Payer: Self-pay | Admitting: Obstetrics and Gynecology

## 2020-01-24 ENCOUNTER — Other Ambulatory Visit: Payer: Self-pay

## 2020-01-24 ENCOUNTER — Ambulatory Visit (INDEPENDENT_AMBULATORY_CARE_PROVIDER_SITE_OTHER): Payer: Self-pay | Admitting: Obstetrics and Gynecology

## 2020-01-24 VITALS — BP 106/74 | HR 67 | Ht 61.0 in | Wt 207.6 lb

## 2020-01-24 DIAGNOSIS — N979 Female infertility, unspecified: Secondary | ICD-10-CM

## 2020-01-24 MED ORDER — LETROZOLE 2.5 MG PO TABS: ORAL_TABLET | ORAL | 0 refills | Status: DC

## 2020-01-24 NOTE — Progress Notes (Signed)
Pt present for follow up for infertility. Pt stated that she was doing well no problems.  

## 2020-01-24 NOTE — Progress Notes (Signed)
    GYNECOLOGY PROGRESS NOTE  Subjective:    Patient ID: Jaime Harvey, female    DOB: 10/23/1990, 29 y.o.   MRN: [id number]  HPI  Patient is a 29 y.o. G0P0000 female who presents for follow up of primary infertility.  She was initiated on Femara in August. Previously did several cycles of Clomid with success of conception.  Reports no side effects with Femara, actually feels like it is working better for her as she is noting an increase in cervical mucus. Is engaging in timed coitus. She and partner have undergone workup with no findings.   The following portions of the patient's history were reviewed and updated as appropriate: allergies, current medications, past family history, past medical history, past social history, past surgical history and problem list.  Review of Systems Pertinent items noted in HPI and remainder of comprehensive ROS otherwise negative.   Objective:   Blood pressure 106/74, pulse 67, height 5\' 1"  (1.549 m), weight 207 lb 9.6 oz (94.2 kg), last menstrual period 01/08/2020. General appearance: alert and no distress Remainder of exam deferred.    Assessment:   Primary infertility  Plan:  - Continue with Femara, will increase dosing from 2.5 mg to 5 mg.  Continue for 3 months.   - To f/u in 3 months, will also perform annual exam at that time.     13/09/2019, MD [facility]

## 2020-01-25 ENCOUNTER — Encounter: Payer: Self-pay | Admitting: Obstetrics and Gynecology

## 2020-01-25 NOTE — Patient Instructions (Signed)
Letrozole tablets What is this medicine? LETROZOLE (LET roe zole) blocks the production of estrogen. It is used to treat breast cancer. This medicine may be used for other purposes; ask your health care provider or pharmacist if you have questions. COMMON BRAND NAME(S): Femara What should I tell my health care provider before I take this medicine? They need to know if you have any of these conditions:  high cholesterol  liver disease  osteoporosis (weak bones)  an unusual or allergic reaction to letrozole, other medicines, foods, dyes, or preservatives  pregnant or trying to get pregnant  breast-feeding How should I use this medicine? Take this medicine by mouth with a glass of water. You may take it with or without food. Follow the directions on the prescription label. Take your medicine at regular intervals. Do not take your medicine more often than directed. Do not stop taking except on your doctor's advice. Talk to your pediatrician regarding the use of this medicine in children. Special care may be needed. Overdosage: If you think you have taken too much of this medicine contact a poison control center or emergency room at once. NOTE: This medicine is only for you. Do not share this medicine with others. What if I miss a dose? If you miss a dose, take it as soon as you can. If it is almost time for your next dose, take only that dose. Do not take double or extra doses. What may interact with this medicine? Do not take this medicine with any of the following medications:  estrogens, like hormone replacement therapy or birth control pills This medicine may also interact with the following medications:  dietary supplements such as androstenedione or DHEA  prasterone  tamoxifen This list may not describe all possible interactions. Give your health care provider a list of all the medicines, herbs, non-prescription drugs, or dietary supplements you use. Also tell them if you  smoke, drink alcohol, or use illegal drugs. Some items may interact with your medicine. What should I watch for while using this medicine? Tell your doctor or healthcare professional if your symptoms do not start to get better or if they get worse. Do not become pregnant while taking this medicine or for 3 weeks after stopping it. Women should inform their doctor if they wish to become pregnant or think they might be pregnant. There is a potential for serious side effects to an unborn child. Talk to your health care professional or pharmacist for more information. Do not breast-feed while taking this medicine or for 3 weeks after stopping it. This medicine may interfere with the ability to have a child. Talk with your doctor or health care professional if you are concerned about your fertility. Using this medicine for a long time may increase your risk of low bone mass. Talk to your doctor about bone health. You may get drowsy or dizzy. Do not drive, use machinery, or do anything that needs mental alertness until you know how this medicine affects you. Do not stand or sit up quickly, especially if you are an older patient. This reduces the risk of dizzy or fainting spells. You may need blood work done while you are taking this medicine. What side effects may I notice from receiving this medicine? Side effects that you should report to your doctor or health care professional as soon as possible:  allergic reactions like skin rash, itching, or hives  bone fracture  chest pain  signs and symptoms of a blood clot   such as breathing problems; changes in vision; chest pain; severe, sudden headache; pain, swelling, warmth in the leg; trouble speaking; sudden numbness or weakness of the face, arm or leg  vaginal bleeding Side effects that usually do not require medical attention (report to your doctor or health care professional if they continue or are bothersome):  bone, back, joint, or muscle  pain  dizziness  fatigue  fluid retention  headache  hot flashes, night sweats  nausea  weight gain This list may not describe all possible side effects. Call your doctor for medical advice about side effects. You may report side effects to FDA at 1-800-FDA-1088. Where should I keep my medicine? Keep out of the reach of children. Store between 15 and 30 degrees C (59 and 86 degrees F). Throw away any unused medicine after the expiration date. NOTE: This sheet is a summary. It may not cover all possible information. If you have questions about this medicine, talk to your doctor, pharmacist, or health care provider.  2020 Elsevier/Gold Standard (2015-09-24 11:10:41)  

## 2020-02-21 ENCOUNTER — Telehealth: Payer: Self-pay

## 2020-02-21 NOTE — Telephone Encounter (Signed)
Pt called in and stated that she is have some cramping pain. The pt isnt on her period the pt had it the 6th of this month. The pt said that she is in a lot of pain. Pt isn't bleeding. The pt sounded like she was very un- comfortable and that she stated that she didn't know if she could take anything for the pian. The pt was told if she was in a lot of pain to go to the ED or urgent clinic. The pt said that she will go on and go. The pt still would like a call back. Please advise

## 2020-02-21 NOTE — Telephone Encounter (Signed)
Pt called in and stated that she was returning a call from the nurse. The pt said that the pain has went away. The pt is requesting a call back wanting to know why she was in pain. Pt said that she didn't want go to the ED. Please advise

## 2020-02-21 NOTE — Telephone Encounter (Signed)
Pt called no answer LM via VM stating that I was returning her call concerning being in a lot of pain. Advised pt via VM to go to the ED if she was in a lot of pain and pain medication did not relieve it.

## 2020-05-25 ENCOUNTER — Encounter: Payer: Medicaid Other | Admitting: Obstetrics and Gynecology

## 2020-05-30 ENCOUNTER — Encounter: Payer: Medicaid Other | Admitting: Obstetrics and Gynecology

## 2020-06-05 ENCOUNTER — Encounter: Payer: Self-pay | Admitting: Obstetrics and Gynecology

## 2020-06-05 ENCOUNTER — Other Ambulatory Visit: Payer: Self-pay

## 2020-06-05 ENCOUNTER — Ambulatory Visit (INDEPENDENT_AMBULATORY_CARE_PROVIDER_SITE_OTHER): Payer: Self-pay | Admitting: Obstetrics and Gynecology

## 2020-06-05 VITALS — BP 136/89 | HR 84 | Ht 61.0 in | Wt 204.5 lb

## 2020-06-05 DIAGNOSIS — Z8742 Personal history of other diseases of the female genital tract: Secondary | ICD-10-CM

## 2020-06-05 DIAGNOSIS — R21 Rash and other nonspecific skin eruption: Secondary | ICD-10-CM

## 2020-06-05 DIAGNOSIS — R102 Pelvic and perineal pain: Secondary | ICD-10-CM

## 2020-06-05 DIAGNOSIS — Z809 Family history of malignant neoplasm, unspecified: Secondary | ICD-10-CM

## 2020-06-05 NOTE — Progress Notes (Signed)
Pt present for excessive hair and cancer.  Pt c/o severe vaginal cramping; rashes/itching on arms, legs and entire body.

## 2020-06-05 NOTE — Patient Instructions (Signed)
Pelvic Pain, Female Pelvic pain is pain in your lower belly (abdomen), below your belly button and between your hips. The pain may start suddenly (be acute), keep coming back (be recurring), or last a long time (become chronic). Pelvic pain that lasts longer than 6 months is called chronic pelvic pain. There are many causes of pelvic pain. Sometimes the cause of pelvic pain is not known. Follow these instructions at home:  Take over-the-counter and prescription medicines only as told by your doctor.  Rest as told by your doctor.  Do not have sex if it hurts.  Keep a journal of your pelvic pain. Write down: ? When the pain started. ? Where the pain is located. ? What seems to make the pain better or worse, such as food or your period (menstrual cycle). ? Any symptoms you have along with the pain.  Keep all follow-up visits as told by your doctor. This is important.   Contact a doctor if:  Medicine does not help your pain.  Your pain comes back.  You have new symptoms.  You have unusual discharge or bleeding from your vagina.  You have a fever or chills.  You are having trouble pooping (constipation).  You have blood in your pee (urine) or poop (stool).  Your pee smells bad.  You feel weak or light-headed. Get help right away if:  You have sudden pain that is very bad.  Your pain keeps getting worse.  You have very bad pain and also have any of these symptoms: ? A fever. ? Feeling sick to your stomach (nausea). ? Throwing up (vomiting). ? Being very sweaty.  You pass out (lose consciousness). Summary  Pelvic pain is pain in your lower belly (abdomen), below your belly button and between your hips.  There are many possible causes of pelvic pain.  Keep a journal of your pelvic pain. This information is not intended to replace advice given to you by your health care provider. Make sure you discuss any questions you have with your health care provider. Document  Revised: 08/05/2017 Document Reviewed: 08/05/2017 Elsevier Patient Education  2021 Elsevier Inc.  

## 2020-06-05 NOTE — Progress Notes (Signed)
GYNECOLOGY PROGRESS NOTE  Subjective:    Patient ID: Jaime Harvey, female    DOB: 1990/07/30, 30 y.o.   MRN: [id number]  HPI  Patient is a 30 y.o. G0P0000 female who presents for the following complaints:  1) Cramps - is having very bad pelvic cramping mostly noted in the midline, but sometimes on the right side as well.  Lasts ~ 10-15 minutes at a time. Sometimes wakes her up out of her sleep. Also notes cramping gets worse after sex or with onset of her periods.  Has to hold her abdomen to ambulate sometimes.  Has been ongoing for "a good while". Denies nausea/vomiting, dysuria, vaginal discharge. Takes Ibuprofen when needed.  2. Reports a rash, itching all over her body.  Started initially on her back, now over arms, legs, and abdomen. Denies any recent skin irritants or contacts. Does have pets in the home. Has tried oatmeal lotion.  3. Reports having a maternal uncle who passed from leukemia recently (no risk factors, age early 17s) . Also PGM who passed from cancer (unknown type, unknown age).  Concerned about her cancer risk.    The following portions of the patient's history were reviewed and updated as appropriate: allergies, current medications, past family history, past medical history, past social history, past surgical history and problem list.  Review of Systems Pertinent items noted in HPI and remainder of comprehensive ROS otherwise negative.   Objective:   Blood pressure 136/89, pulse 84, height 5\' 1"  (1.549 m), weight 204 lb 8 oz (92.8 kg), last menstrual period 05/07/2020. General appearance: alert and no distress Abdomen: soft, non-tender; bowel sounds normal; no masses,  no organomegaly Pelvic: external genitalia normal, rectovaginal septum normal.  Vagina without discharge.  Cervix normal appearing, no lesions and no motion tenderness.  Uterus mobile, nontender, normal shape and size.  Adnexae with mild tenderness on right. Non-palpable, nontender bilaterally.   Extremities: extremities normal, atraumatic, no cyanosis or edema Neurologic: Grossly normal  Skin - no sores or suspicious lesions or color changes. Positive for several areas of  scattered small papules along arms, legs, and across abdomen. No discernable pattern seen.    Assessment:   1. Pelvic pain   2. History of ovarian cyst   3. Rash   4. Family history of cancer    Plan:   1.  Pelvic pain - unclear cause at this time.  Patient does have a previous history of ovarian cysts in the past.  Will order pelvic ultrasound to rule this out.  Pain is also associated prior to and even outside of her menstrual cycle.  In conjunction with her history of infertility cannot completely rule out diagnosis of endometriosis as she also has had a prior history of abnormal cycles in the past prior to regulation for attempts to conceive.  I discussed that management of her pain and to help rule out endometriosis the least invasive approach would be to try hormonal suppression usually with a contraceptive option, however patient still is desiring conception and so would like to hold off on hormones if possible.  Also discussed gold standard for diagnosis of endometriosis would be laparoscopy.  In the meantime, patient can continue to use NSAIDs and alternate with Tylenol as needed.  Discussed initiation of NSAIDs a day or so prior to her cycles to help manage pain at that point.  Currently she is not using the Femara due to cost, so less concerns for ovarian hyperstimulation. 2.  Rash -unclear  cause of rash, patient has been using oatmeal lotion to help with itching.  Possibly a more exacerbated response to an insect bite.  I discussed that it would likely be beneficial to use a over-the-counter steroid cream such as hydrocortisone and can continue to use oatmeal or aloe vera for itching. 3.  Reviewed patient's family history for cancer, discussed that based on her history she was still likely at the same risk as  general population for cancer.  Advised that if future family members did develop cancer she could consider hereditary cancer screening to further assess her risk.   Hildred Laser , MD [facility]

## 2020-07-05 ENCOUNTER — Telehealth: Payer: Self-pay

## 2020-07-05 NOTE — Telephone Encounter (Signed)
Error

## 2020-07-16 ENCOUNTER — Telehealth: Payer: Self-pay | Admitting: Obstetrics and Gynecology

## 2020-07-16 NOTE — Telephone Encounter (Signed)
New Message:   Pt called and states that she has had a yeast infection since last Thursday and it doesn't seem to be going away.  Pt is requesting a RX for Diflucan.

## 2020-07-17 ENCOUNTER — Other Ambulatory Visit: Payer: Self-pay

## 2020-07-17 MED ORDER — FLUCONAZOLE 150 MG PO TABS: 150.0000 mg | ORAL_TABLET | Freq: Once | ORAL | 0 refills | Status: AC

## 2020-07-18 NOTE — Telephone Encounter (Signed)
Please see my chart messages

## 2020-07-24 ENCOUNTER — Ambulatory Visit
Admission: RE | Admit: 2020-07-24 | Discharge: 2020-07-24 | Disposition: A | Discharge status: Home / Self Care | Payer: Self-pay | Source: Ambulatory Visit | Attending: Obstetrics and Gynecology | Admitting: Obstetrics and Gynecology

## 2020-07-24 ENCOUNTER — Other Ambulatory Visit: Payer: Self-pay

## 2020-07-24 DIAGNOSIS — Z8742 Personal history of other diseases of the female genital tract: Secondary | ICD-10-CM | POA: Insufficient documentation

## 2020-07-24 DIAGNOSIS — R102 Pelvic and perineal pain: Secondary | ICD-10-CM | POA: Insufficient documentation

## 2021-02-19 ENCOUNTER — Encounter: Payer: Self-pay | Admitting: Obstetrics and Gynecology

## 2021-02-19 ENCOUNTER — Ambulatory Visit (INDEPENDENT_AMBULATORY_CARE_PROVIDER_SITE_OTHER): Payer: Medicaid Other | Admitting: Obstetrics and Gynecology

## 2021-02-19 ENCOUNTER — Other Ambulatory Visit: Payer: Self-pay

## 2021-02-19 ENCOUNTER — Other Ambulatory Visit (HOSPITAL_COMMUNITY)
Admission: RE | Admit: 2021-02-19 | Discharge: 2021-02-19 | Disposition: A | Discharge status: Home / Self Care | Payer: Medicaid Other | Source: Ambulatory Visit | Attending: Obstetrics and Gynecology | Admitting: Obstetrics and Gynecology

## 2021-02-19 VITALS — BP 110/73 | HR 62 | Resp 16 | Ht 61.5 in | Wt 204.7 lb

## 2021-02-19 DIAGNOSIS — Z124 Encounter for screening for malignant neoplasm of cervix: Secondary | ICD-10-CM | POA: Diagnosis not present

## 2021-02-19 DIAGNOSIS — Z01419 Encounter for gynecological examination (general) (routine) without abnormal findings: Secondary | ICD-10-CM | POA: Diagnosis not present

## 2021-02-19 DIAGNOSIS — E669 Obesity, unspecified: Secondary | ICD-10-CM

## 2021-02-19 DIAGNOSIS — N979 Female infertility, unspecified: Secondary | ICD-10-CM

## 2021-02-19 MED ORDER — LETROZOLE 2.5 MG PO TABS: 7.5000 mg | ORAL_TABLET | Freq: Every day | ORAL | 3 refills | Status: DC

## 2021-02-19 NOTE — Progress Notes (Signed)
GYNECOLOGY ANNUAL PHYSICAL EXAM PROGRESS NOTE  Subjective:    Jaime Harvey is a 30 y.o. G0P0000 female who presents for an annual exam. The patient has no complaints today. The patient is sexually active. The patient participates in regular exercise: no. Has the patient ever been transfused or tattooed?: no. The patient reports that there is not domestic violence in her life.   Reports that she took a break from ovulation induction, but is ready to resume again at this time. Cycles are regularly occurring.   Menstrual History: Menarche age: 82 Patient's last menstrual period was 02/18/2021 (exact date). Period Cycle (Days): 28 Period Duration (Days): 3 Period Pattern: Regular Menstrual Flow: Moderate Menstrual Control: Maxi pad Menstrual Control Change Freq (Hours): 2-3 Dysmenorrhea: None   Gynecologic History:  Contraception: none History of STI's: Denies Last Pap: 03/23/2018. Results were: normal.  Denies h/o abnormal pap smears. Last mammogram: not applicable.     OB History  Gravida Para Term Preterm AB Living  0 0 0 0 0 0  SAB IAB Ectopic Multiple Live Births  0 0 0 0 0    Past Medical History:  Diagnosis Date   No known health problems     Past Surgical History:  Procedure Laterality Date   HYSTEROSALPINGOGRAM  02/07/2019       NO PAST SURGERIES      Family History  Problem Relation Age of Onset   Healthy Mother    Diabetes Maternal Grandmother    Hypertension Maternal Grandmother    Healthy Father    Hypotension Maternal Grandfather    Diabetes Maternal Grandfather     Social History   Socioeconomic History   Marital status: Married    Spouse name: Not on file   Number of children: Not on file   Years of education: Not on file   Highest education level: Not on file  Occupational History   Not on file  Tobacco Use   Smoking status: Never   Smokeless tobacco: Never  Vaping Use   Vaping Use: Never used  Substance and Sexual  Activity   Alcohol use: Not Currently   Drug use: No   Sexual activity: Yes    Birth control/protection: None  Other Topics Concern   Not on file  Social History Narrative   Not on file   Social Determinants of Health   Financial Resource Strain: Not on file  Food Insecurity: Not on file  Transportation Needs: Not on file  Physical Activity: Not on file  Stress: Not on file  Social Connections: Not on file  Intimate Partner Violence: Not on file    Current Outpatient Medications on File Prior to Visit  Medication Sig Dispense Refill   letrozole (FEMARA) 2.5 MG tablet Take 2 tablet daily for 5 days, beginning on Day 5 of each cycle. (Patient not taking: Reported on 06/05/2020) 30 tablet 0   UNABLE TO FIND Take by mouth daily. Conceiveeasy- take one tablet daily. (Patient not taking: No sig reported)     No current facility-administered medications on file prior to visit.    Allergies  Allergen Reactions   Tramadol Other (See Comments)    "Panic attack and anxiety"     Review of Systems Constitutional: negative for chills, fatigue, fevers and sweats Eyes: negative for irritation, redness and visual disturbance Ears, nose, mouth, throat, and face: negative for hearing loss, nasal congestion, snoring and tinnitus Respiratory: negative for asthma, cough, sputum Cardiovascular: negative for chest pain, dyspnea,  exertional chest pressure/discomfort, irregular heart beat, palpitations and syncope Gastrointestinal: negative for abdominal pain, change in bowel habits, nausea and vomiting Genitourinary: negative for abnormal menstrual periods, genital lesions, sexual problems and vaginal discharge, dysuria and urinary incontinence Integument/breast: negative for breast lump, breast tenderness and nipple discharge Hematologic/lymphatic: negative for bleeding and easy bruising Musculoskeletal:negative for back pain and muscle weakness Neurological: negative for dizziness, headaches,  vertigo and weakness Endocrine: negative for diabetic symptoms including polydipsia, polyuria and skin dryness Allergic/Immunologic: negative for hay fever and urticaria      Objective:  Blood pressure 110/73, pulse 62, resp. rate 16, height 5' 1.5" (1.562 m), weight 204 lb 11.2 oz (92.9 kg), last menstrual period 02/18/2021.  Body mass index is 38.05 kg/m.  General Appearance:    Alert, cooperative, no distress, appears stated age, moderate obesity  Head:    Normocephalic, without obvious abnormality, atraumatic  Eyes:    PERRL, conjunctiva/corneas clear, EOM's intact, both eyes  Ears:    Normal external ear canals, both ears  Nose:   Nares normal, septum midline, mucosa normal, no drainage or sinus tenderness  Throat:   Lips, mucosa, and tongue normal; teeth and gums normal  Neck:   Supple, symmetrical, trachea midline, no adenopathy; thyroid: no enlargement/tenderness/nodules; no carotid bruit or JVD  Back:     Symmetric, no curvature, ROM normal, no CVA tenderness  Lungs:     Clear to auscultation bilaterally, respirations unlabored  Chest Wall:    No tenderness or deformity   Heart:    Regular rate and rhythm, S1 and S2 normal, no murmur, rub or gallop  Breast Exam:    No tenderness, masses, or nipple abnormality  Abdomen:     Soft, non-tender, bowel sounds active all four quadrants, no masses, no organomegaly.    Genitalia:    Pelvic:external genitalia normal, vagina without lesions, discharge, or tenderness, rectovaginal septum  normal. Cervix normal in appearance, no cervical motion tenderness, no adnexal masses or tenderness.  Uterus normal size, shape, mobile, regular contours, nontender.  Rectal:    Normal external sphincter.  No hemorrhoids appreciated. Internal exam not done.   Extremities:   Extremities normal, atraumatic, no cyanosis or edema  Pulses:   2+ and symmetric all extremities  Skin:   Skin color, texture, turgor normal, no rashes or lesions  Lymph nodes:    Cervical, supraclavicular, and axillary nodes normal  Neurologic:   CNII-XII intact, normal strength, sensation and reflexes throughout   .  Labs:  Lab Results  Component Value Date   WBC 8.8 03/04/2018   HGB 14.1 03/04/2018   HCT 40.8 03/04/2018   MCV 88.1 03/04/2018   PLT 303 03/04/2018    Lab Results  Component Value Date   CREATININE 0.92 03/04/2018   BUN 12 03/04/2018   NA 138 03/04/2018   K 3.5 03/04/2018   CL 104 03/04/2018   CO2 28 03/04/2018    Lab Results  Component Value Date   ALT 20 03/04/2018   AST 20 03/04/2018   ALKPHOS 80 03/04/2018   BILITOT 0.5 03/04/2018    Lab Results  Component Value Date   TSH 1.270 05/18/2018     Assessment:   1. Encounter for well woman exam with routine gynecological exam   2. Obesity (BMI 30.0-34.9)   3. Female infertility, unexplained      Plan:  Blood tests: CBC with diff, Comprehensive metabolic panel, FSH, LH, TSH, Hemoglobin A1c, and Lipid panel. Breast self exam technique reviewed and patient  encouraged to perform self-exam monthly. Contraception: none.  Desires to conceive. Would like to resume ovulation induction meds. Prescribed increased dose of Femara (7.5 mg). Can begin today.  Discussed healthy lifestyle modifications. Mammogram  to begin screening at age 32.  Pap smear  up to date. Due in 1 year . COVID vaccination status: Declines Flu vaccine: Declines.  Follow up in 1 year for annual exam. Follow up in 3 months for infertility.    Hildred Laser, MD [facility]

## 2021-02-20 LAB — LIPID PANEL
Chol/HDL Ratio: 3.3 ratio (ref 0.0–4.4)
Cholesterol, Total: 173 mg/dL (ref 100–199)
HDL: 52 mg/dL (ref >39)
LDL Chol Calc (NIH): 104 mg/dL — ABNORMAL HIGH (ref 0–99)
Triglycerides: 90 mg/dL (ref 0–149)
VLDL Cholesterol Cal: 17 mg/dL (ref 5–40)

## 2021-02-20 LAB — HEMOGLOBIN A1C
Est. average glucose Bld gHb Est-mCnc: 111 mg/dL
Hgb A1c MFr Bld: 5.5 % (ref 4.8–5.6)

## 2021-02-20 LAB — FSH/LH
FSH: 7.4 m[IU]/mL
LH: 6.1 m[IU]/mL

## 2021-02-20 LAB — COMPREHENSIVE METABOLIC PANEL
ALT: 22 IU/L (ref 0–32)
AST: 22 IU/L (ref 0–40)
Albumin/Globulin Ratio: 1.6 (ref 1.2–2.2)
Albumin: 4.5 g/dL (ref 3.9–5.0)
Alkaline Phosphatase: 104 IU/L (ref 44–121)
BUN/Creatinine Ratio: 15 (ref 9–23)
BUN: 15 mg/dL (ref 6–20)
Bilirubin Total: 0.5 mg/dL (ref 0.0–1.2)
CO2: 26 mmol/L (ref 20–29)
Calcium: 9.2 mg/dL (ref 8.7–10.2)
Chloride: 99 mmol/L (ref 96–106)
Creatinine, Ser: 1.03 mg/dL — ABNORMAL HIGH (ref 0.57–1.00)
Globulin, Total: 2.9 g/dL (ref 1.5–4.5)
Glucose: 85 mg/dL (ref 70–99)
Potassium: 3.8 mmol/L (ref 3.5–5.2)
Sodium: 140 mmol/L (ref 134–144)
Total Protein: 7.4 g/dL (ref 6.0–8.5)
eGFR: 75 mL/min/{1.73_m2} (ref >59)

## 2021-02-20 LAB — CBC
Hematocrit: 45.5 % (ref 34.0–46.6)
Hemoglobin: 15.2 g/dL (ref 11.1–15.9)
MCH: 30.3 pg (ref 26.6–33.0)
MCHC: 33.4 g/dL (ref 31.5–35.7)
MCV: 91 fL (ref 79–97)
Platelets: 306 10*3/uL (ref 150–450)
RBC: 5.02 x10E6/uL (ref 3.77–5.28)
RDW: 11.8 % (ref 11.7–15.4)
WBC: 7.1 10*3/uL (ref 3.4–10.8)

## 2021-02-20 LAB — TSH: TSH: 0.933 u[IU]/mL (ref 0.450–4.500)

## 2021-02-22 LAB — CYTOLOGY - PAP: Diagnosis: NEGATIVE

## 2021-03-10 ENCOUNTER — Encounter: Payer: Self-pay | Admitting: Obstetrics and Gynecology

## 2021-03-14 ENCOUNTER — Encounter: Payer: Self-pay | Admitting: Obstetrics and Gynecology

## 2021-03-25 ENCOUNTER — Encounter: Payer: Self-pay | Admitting: Obstetrics and Gynecology

## 2021-04-24 ENCOUNTER — Emergency Department
Admission: EM | Admit: 2021-04-24 | Discharge: 2021-04-24 | Disposition: A | Discharge status: Home / Self Care | Payer: Medicaid Other | Attending: Emergency Medicine | Admitting: Emergency Medicine

## 2021-04-24 ENCOUNTER — Other Ambulatory Visit: Payer: Self-pay

## 2021-04-24 DIAGNOSIS — R112 Nausea with vomiting, unspecified: Secondary | ICD-10-CM | POA: Diagnosis not present

## 2021-04-24 DIAGNOSIS — R197 Diarrhea, unspecified: Secondary | ICD-10-CM | POA: Insufficient documentation

## 2021-04-24 DIAGNOSIS — R000 Tachycardia, unspecified: Secondary | ICD-10-CM | POA: Insufficient documentation

## 2021-04-24 LAB — URINALYSIS, ROUTINE W REFLEX MICROSCOPIC
Bilirubin Urine: NEGATIVE
Glucose, UA: NEGATIVE mg/dL
Hgb urine dipstick: NEGATIVE
Ketones, ur: 20 mg/dL — AB
Nitrite: NEGATIVE
Protein, ur: NEGATIVE mg/dL
Specific Gravity, Urine: 1.031 — ABNORMAL HIGH (ref 1.005–1.030)
pH: 5 (ref 5.0–8.0)

## 2021-04-24 LAB — COMPREHENSIVE METABOLIC PANEL
ALT: 20 U/L (ref 0–44)
AST: 21 U/L (ref 15–41)
Albumin: 3.8 g/dL (ref 3.5–5.0)
Alkaline Phosphatase: 70 U/L (ref 38–126)
Anion gap: 9 (ref 5–15)
BUN: 20 mg/dL (ref 6–20)
CO2: 26 mmol/L (ref 22–32)
Calcium: 8.9 mg/dL (ref 8.9–10.3)
Chloride: 105 mmol/L (ref 98–111)
Creatinine, Ser: 1.04 mg/dL — ABNORMAL HIGH (ref 0.44–1.00)
GFR, Estimated: >60 mL/min (ref >60)
Glucose, Bld: 159 mg/dL — ABNORMAL HIGH (ref 70–99)
Potassium: 3.5 mmol/L (ref 3.5–5.1)
Sodium: 140 mmol/L (ref 135–145)
Total Bilirubin: 0.9 mg/dL (ref 0.3–1.2)
Total Protein: 7.4 g/dL (ref 6.5–8.1)

## 2021-04-24 LAB — CBC
HCT: 42.7 % (ref 36.0–46.0)
Hemoglobin: 14.3 g/dL (ref 12.0–15.0)
MCH: 29.6 pg (ref 26.0–34.0)
MCHC: 33.5 g/dL (ref 30.0–36.0)
MCV: 88.4 fL (ref 80.0–100.0)
Platelets: 244 10*3/uL (ref 150–400)
RBC: 4.83 MIL/uL (ref 3.87–5.11)
RDW: 11.8 % (ref 11.5–15.5)
WBC: 9.4 10*3/uL (ref 4.0–10.5)
nRBC: 0 % (ref 0.0–0.2)

## 2021-04-24 LAB — LIPASE, BLOOD: Lipase: 33 U/L (ref 11–51)

## 2021-04-24 LAB — POC URINE PREG, ED: Preg Test, Ur: NEGATIVE

## 2021-04-24 MED ORDER — ONDANSETRON HCL 4 MG/2ML IJ SOLN
INTRAMUSCULAR | Status: AC
  Filled 2021-04-24: qty 2

## 2021-04-24 MED ORDER — ONDANSETRON HCL 4 MG/2ML IJ SOLN
4.0000 mg | Freq: Once | INTRAMUSCULAR | Status: AC
  Administered 2021-04-24: 4 mg via INTRAVENOUS
  Filled 2021-04-24: qty 2

## 2021-04-24 MED ORDER — KETOROLAC TROMETHAMINE 30 MG/ML IJ SOLN
15.0000 mg | Freq: Once | INTRAMUSCULAR | Status: DC
  Filled 2021-04-24: qty 1

## 2021-04-24 MED ORDER — ONDANSETRON 4 MG PO TBDP: 4.0000 mg | ORAL_TABLET | Freq: Once | ORAL | Status: DC | PRN

## 2021-04-24 MED ORDER — SODIUM CHLORIDE 0.9 % IV BOLUS
1000.0000 mL | Freq: Once | INTRAVENOUS | Status: AC
  Administered 2021-04-24: 1000 mL via INTRAVENOUS

## 2021-04-24 MED ORDER — ONDANSETRON 4 MG PO TBDP: 4.0000 mg | ORAL_TABLET | Freq: Three times a day (TID) | ORAL | 0 refills | Status: DC | PRN

## 2021-04-24 NOTE — ED Notes (Signed)
Verified correct patient and correct discharge papers given. Pt alert and oriented X 4, stable for discharge. RR even and unlabored, color WNL. Discussed discharge instructions and follow-up as directed. Discharge medications discussed, when prescribed. Pt had opportunity to ask questions, and RN available to provide patient and/or family education.   

## 2021-04-24 NOTE — ED Triage Notes (Signed)
Pt arrived [facility] from home with reports of nausea, vomiting and diarrhea. Pt also c/o chills. Pt states she ate tacos and reports vomiting several times after eating.  Pt tried drinking fluids but continued vomiting.

## 2021-04-24 NOTE — ED Provider Notes (Signed)
[facility] Provider Note    Event Date/Time   First MD Initiated Contact with Patient 04/24/21 [phone]     (approximate)   History   Jaime Harvey   HPI  LIONELA ARAGONEZ is a 31 y.o. female with no significant past medical history who presents for evaluation of vomiting and diarrhea.  The patient reports that her symptoms started last night after she ate home-cooked tacos.  She reports that her husband had the same thing and he is not feeling sick.  She reports close to 10 episodes of nonbloody nonbilious Jaime Harvey and 2 episodes of watery diarrhea.  No coffee-ground Jaime Harvey, hematemesis, melena, hematochezia.  She denies abdominal pain, fever or chills.  No dysuria or hematuria, no cough or congestion.  She reports that she got up to go to the bathroom at home to vomit again and she started feeling lightheaded like she was going to pass out.     Past Medical History:  Diagnosis Date   No known health problems     Past Surgical History:  Procedure Laterality Date   HYSTEROSALPINGOGRAM  02/07/2019       NO PAST SURGERIES       Physical Exam   Triage Vital Signs: ED Triage Vitals  Enc Vitals Group     BP 04/24/21 0216 124/85     Pulse Rate 04/24/21 0216 (!) 104     Resp 04/24/21 0216 16     Temp 04/24/21 0216 98.9 F (37.2 C)     Temp Source 04/24/21 0216 Oral     SpO2 04/24/21 0216 100 %     Weight 04/24/21 0220 204 lb 12.9 oz (92.9 kg)     Height 04/24/21 0220 5\' 1"  (1.549 m)     Head Circumference --      Peak Flow --      Pain Score 04/24/21 0220 0     Pain Loc --      Pain Edu? --      Excl. in [place]? --     Most recent vital signs: Vitals:   04/24/21 0216 04/24/21 0503  BP: 124/85 112/70  Pulse: (!) 104 94  Resp: 16 17  Temp: 98.9 F (37.2 C)   SpO2: 100% 100%     Constitutional: Alert and oriented. Well appearing and in no apparent distress. HEENT:      Head: Normocephalic and atraumatic.         Eyes: Conjunctivae are normal.  Sclera is non-icteric.       Mouth/Throat: Mucous membranes are moist.       Neck: Supple with no signs of meningismus. Cardiovascular: Regular rate and rhythm. No murmurs, gallops, or rubs. 2+ symmetrical distal pulses are present in all extremities.  Respiratory: Normal respiratory effort. Lungs are clear to auscultation bilaterally.  Gastrointestinal: Soft, non tender, and non distended with positive bowel sounds. No rebound or guarding. Genitourinary: No CVA tenderness. Musculoskeletal:  No edema, cyanosis, or erythema of extremities. Neurologic: Normal speech and language. Face is symmetric. Moving all extremities. No gross focal neurologic deficits are appreciated. Skin: Skin is warm, dry and intact. No rash noted. Psychiatric: Mood and affect are normal. Speech and behavior are normal.  ED Results / Procedures / Treatments   Labs (all labs ordered are listed, but only abnormal results are displayed) Labs Reviewed  COMPREHENSIVE METABOLIC PANEL - Abnormal; Notable for the following components:      Result Value   Glucose, Bld 159 (*)  Creatinine, Ser 1.04 (*)    All other components within normal limits  URINALYSIS, ROUTINE W REFLEX MICROSCOPIC - Abnormal; Notable for the following components:   Color, Urine YELLOW (*)    APPearance CLOUDY (*)    Specific Gravity, Urine 1.031 (*)    Ketones, ur 20 (*)    Leukocytes,Ua SMALL (*)    Bacteria, UA FEW (*)    All other components within normal limits  LIPASE, BLOOD  CBC  POC URINE PREG, ED     EKG  none   RADIOLOGY none   PROCEDURES:  Critical Care performed: No  Procedures    IMPRESSION / MDM / ASSESSMENT AND PLAN / ED COURSE  I reviewed the triage vital signs and the nursing notes.  31 y.o. female with no significant past medical history who presents for evaluation of vomiting and diarrhea.  Patient is well-appearing in no distress, slightly tachycardic with a pulse of 104 but normotensive, afebrile,  abdomen is soft and nontender.    Ddx: Food poisoning versus viral gastroenteritis versus gallbladder pathology versus pancreatitis   Plan: CBC, CMP, lipase, urinalysis, pregnancy test.  We will start patient on IV Zofran and IV fluids   MEDICATIONS GIVEN IN ED: Medications  ondansetron (ZOFRAN-ODT) disintegrating tablet 4 mg (has no administration in time range)  ondansetron (ZOFRAN) injection 4 mg ( Intravenous Not Given 04/24/21 0430)  sodium chloride 0.9 % bolus 1,000 mL (0 mLs Intravenous Stopped 04/24/21 0531)     ED COURSE: Labs showing no leukocytosis, no electrolyte derangements, normal LFTs, normal lipase, negative pregnancy test, UA with few bacteria but no nitrites.  Patient denies any symptoms of UTI.  Urine sent for culture.  Mild ketones consistent with mild dehydration.  After IV fluids and IV Zofran, patient feels markedly improved, she passed p.o. challenge.  Serial abdominal exams remain unremarkable with no tenderness.  Admission was considered but felt unnecessary with normal labs and symptoms now resolved.  Will discharge home with Zofran, increase oral hydration, follow-up with PCP.  Discussed my standard return precautions.   Consults: none   EMR reviewed including last visit with her primary care doctor from December 2022 for a well check visit    FINAL CLINICAL IMPRESSION(S) / ED DIAGNOSES   Final diagnoses:  Nausea vomiting and diarrhea     Rx / DC Orders   ED Discharge Orders          Ordered    ondansetron (ZOFRAN-ODT) 4 MG disintegrating tablet  Every 8 hours PRN        04/24/21 0544             Note:  This document was prepared using Dragon voice recognition software and may include unintentional dictation errors.   Please note:  Patient was evaluated in Emergency Department today for the symptoms described in the history of present illness. Patient was evaluated in the context of the global COVID-19 pandemic, which necessitated  consideration that the patient might be at risk for infection with the SARS-CoV-2 virus that causes COVID-19. Institutional protocols and algorithms that pertain to the evaluation of patients at risk for COVID-19 are in a state of rapid change based on information released by regulatory bodies including the CDC and federal and state organizations. These policies and algorithms were followed during the patient's care in the ED.  Some ED evaluations and interventions may be delayed as a result of limited staffing during the pandemic.       Rudene Re, MD  04/24/21 0545 ° °

## 2021-04-24 NOTE — ED Notes (Signed)
Requesting more ice chips, given to patient by RN. No emesis or nausea at this time.

## 2021-04-24 NOTE — ED Notes (Signed)
Able to tolerate ice chips at this time.

## 2021-04-25 ENCOUNTER — Encounter: Payer: Self-pay | Admitting: Obstetrics and Gynecology

## 2021-04-25 DIAGNOSIS — Z5181 Encounter for therapeutic drug level monitoring: Secondary | ICD-10-CM

## 2021-04-25 MED ORDER — SPIRONOLACTONE 50 MG PO TABS: 50.0000 mg | ORAL_TABLET | Freq: Two times a day (BID) | ORAL | 2 refills | Status: DC

## 2021-05-21 ENCOUNTER — Encounter: Payer: Medicaid Other | Admitting: Obstetrics and Gynecology

## 2021-06-11 ENCOUNTER — Encounter: Payer: Medicaid Other | Admitting: Obstetrics and Gynecology

## 2021-06-12 NOTE — Progress Notes (Signed)
? ? ?  GYNECOLOGY PROGRESS NOTE ? ?Subjective:  ? ? Patient ID: Jaime Harvey, female    DOB: 04/12/1990, 31 y.o.   MRN: [id number] ? ?HPI ? Patient is a 31 y.o. G0P0000 female who presents to discuss infertility. She has not been taking the Femara as prescribed because her husband has had some health problems and due to the cost of the medication, so only took for 1 month.  She also has been having some severe abdominal cramps, thinks her period is due soon. Had a 23 day cycle with last cycle, usually 32 days. Has been taking OTC Ibuprofen and using heating pads.  ? ?She also desires to know what can be done about her facial hair. Is using an OTC oil that is supposed to reduce hair growth, but does not feel like it is working.  ? ?The following portions of the patient's history were reviewed and updated as appropriate: allergies, current medications, past family history, past medical history, past social history, past surgical history, and problem list. ? ?Review of Systems ?Pertinent items noted in HPI and remainder of comprehensive ROS otherwise negative.  ? ?Objective:  ? Blood pressure 125/83, pulse 79, resp. rate 16, height 5\' 1"  (1.549 m), weight 200 lb 3.2 oz (90.8 kg), last menstrual period 05/19/2021. Body mass index is 37.83 kg/m?. ?General appearance: alert and no distress ?Skin: fine hair along chin and upper lip. Patient reports recently plucking.  ? ? ? ?Assessment:  ? ?1. Primary female infertility   ?2. Hirsutism   ?  ? ?Plan:  ? ?Infertility, holding for now as partner dealing with health issues.  ?Hirsutism, discussed options of short term use of androgenic birth control (also can better regulate her cycles) vs use Spironolactone.  Discussed risks and benefits.  Patient desires to try Spironolactone. Will prescribe.  Will f/u with potassium levels in 3 months.  ? ? ?05/21/2021, MD ?[facility] ? ?

## 2021-06-13 ENCOUNTER — Ambulatory Visit (INDEPENDENT_AMBULATORY_CARE_PROVIDER_SITE_OTHER): Payer: Self-pay | Admitting: Obstetrics and Gynecology

## 2021-06-13 ENCOUNTER — Encounter: Payer: Self-pay | Admitting: Obstetrics and Gynecology

## 2021-06-13 VITALS — BP 125/83 | HR 79 | Resp 16 | Ht 61.0 in | Wt 200.2 lb

## 2021-06-13 DIAGNOSIS — L680 Hirsutism: Secondary | ICD-10-CM

## 2021-06-13 DIAGNOSIS — N979 Female infertility, unspecified: Secondary | ICD-10-CM

## 2021-06-13 MED ORDER — SPIRONOLACTONE 50 MG PO TABS: 50.0000 mg | ORAL_TABLET | Freq: Two times a day (BID) | ORAL | 2 refills | Status: DC

## 2021-06-13 MED ORDER — IBUPROFEN 800 MG PO TABS: 800.0000 mg | ORAL_TABLET | Freq: Three times a day (TID) | ORAL | 1 refills | Status: AC | PRN

## 2021-08-19 IMAGING — RF DG HYSTEROGRAM
1 series · 1 of 1 positions shown · IV contrast (omnipaque)
Comparison: Pelvic ultrasound 03/04/2018.
COMPARISON: Pelvic ultrasound 03/04/2018.

Addendum:
CLINICAL DATA: Infertility

EXAM:
HYSTEROSALPINGOGRAM
TECHNIQUE: Following cleansing of the cervix and vagina with Betadine solution,
a hysterosalpingogram was performed using a 5-French
hysterosalpingogram catheter and Omnipaque 300 contrast. The patient
tolerated the examination without difficulty.

[Series 1: cp_standard · 0.25mm/px · 1 of 1 slices shown]
[im 1/1]
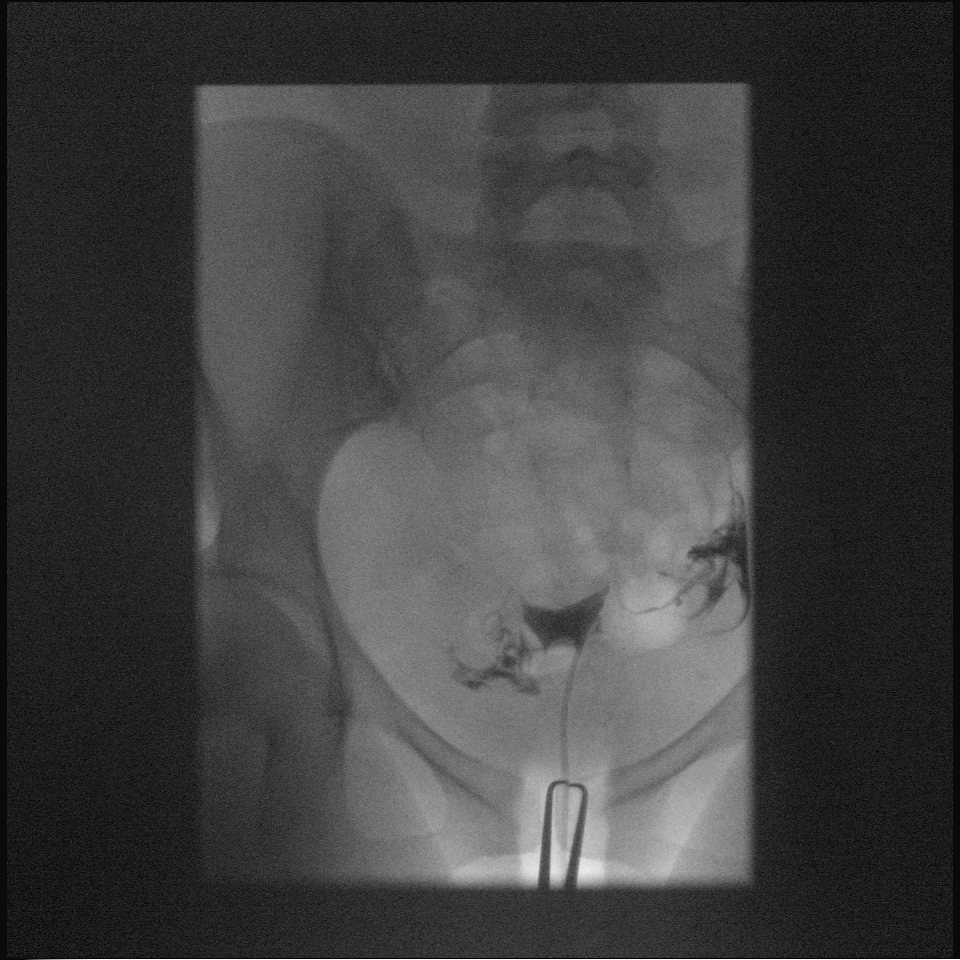

[1 of 1 positions shown; findings below may reference images not displayed]

FLUOROSCOPY TIME:  Radiation Exposure Index (as provided by the
fluoroscopic device): 5.9 mGy

Fluoroscopy Time:  24 seconds

Number of Acquired Images:  1 screen capture, 0 full exposures
FINDINGS: Endometrial cavity is normal. Both fallopian tubes fill and have a
normal appearance. Normal spillage bilaterally.
IMPRESSION: Unremarkable hysterosalpingogram with patent fallopian tubes
bilaterally.

ADDENDUM:
Addendum is created as the incorrect technique was included in the
original dictation. The technique section should read:
"Hysterosalpingogram was performed by OB/Gyn physician and
fluoroscopy was provided by the [HOSPITAL]."

Please see the performing physicians note, Dr. Per-Lennart, for further
procedural detail.

*** End of Addendum ***
FLUOROSCOPY TIME:  Radiation Exposure Index (as provided by the
fluoroscopic device): 5.9 mGy

Fluoroscopy Time:  24 seconds

Number of Acquired Images:  1 screen capture, 0 full exposures
FINDINGS: Endometrial cavity is normal. Both fallopian tubes fill and have a
normal appearance. Normal spillage bilaterally.
IMPRESSION: Unremarkable hysterosalpingogram with patent fallopian tubes
bilaterally.

## 2021-09-11 ENCOUNTER — Encounter: Payer: Medicaid Other | Admitting: Obstetrics and Gynecology

## 2021-09-12 ENCOUNTER — Encounter: Payer: Medicaid Other | Admitting: Obstetrics and Gynecology

## 2021-12-02 ENCOUNTER — Encounter: Payer: Self-pay | Admitting: Emergency Medicine

## 2021-12-02 ENCOUNTER — Emergency Department: Payer: No Typology Code available for payment source

## 2021-12-02 DIAGNOSIS — R0789 Other chest pain: Secondary | ICD-10-CM | POA: Insufficient documentation

## 2021-12-02 DIAGNOSIS — Y92410 Unspecified street and highway as the place of occurrence of the external cause: Secondary | ICD-10-CM | POA: Insufficient documentation
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
## 2021-12-03 ENCOUNTER — Emergency Department
Admission: EM | Admit: 2021-12-03 | Discharge: 2021-12-03 | Disposition: A | Discharge status: Home / Self Care | Payer: No Typology Code available for payment source | Attending: Emergency Medicine | Admitting: Emergency Medicine

## 2021-12-03 DIAGNOSIS — R0789 Other chest pain: Secondary | ICD-10-CM

## 2021-12-03 MED ORDER — IBUPROFEN 400 MG PO TABS
400.0000 mg | ORAL_TABLET | Freq: Once | ORAL | Status: AC
  Administered 2021-12-03: 400 mg via ORAL
  Filled 2021-12-03: qty 1

## 2021-12-03 MED ORDER — ACETAMINOPHEN 500 MG PO TABS
1000.0000 mg | ORAL_TABLET | Freq: Once | ORAL | Status: AC
  Administered 2021-12-03: 1000 mg via ORAL
  Filled 2021-12-03: qty 2
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
## 2021-12-03 NOTE — Discharge Instructions (Signed)
Your x-ray did not show any rib fracture or injury to your lung.  Please take Tylenol Motrin for pain you likely have injury to your muscles of your chest wall which is causing your pain.  You can also ice the area.  You are likely to be more sore tomorrow.

## 2021-12-03 NOTE — ED Notes (Signed)
DC instructions given verbally and in writing, understanding voiced, signature obtained.  Pt left in stable condition to POV. 

## 2022-01-31 ENCOUNTER — Ambulatory Visit (LOCAL_COMMUNITY_HEALTH_CENTER): Payer: Medicaid Other

## 2022-01-31 DIAGNOSIS — Z7185 Encounter for immunization safety counseling: Secondary | ICD-10-CM

## 2022-01-31 DIAGNOSIS — Z419 Encounter for procedure for purposes other than remedying health state, unspecified: Secondary | ICD-10-CM | POA: Diagnosis not present

## 2022-01-31 DIAGNOSIS — Z23 Encounter for immunization: Secondary | ICD-10-CM | POA: Diagnosis not present

## 2022-01-31 NOTE — Progress Notes (Signed)
  Are you feeling sick today? No   Have you ever received a dose of COVID-19 Vaccine? [employer], [place], [place], [place], Other) Yes  If yes, which vaccine and how many doses?   Pfizer and 3 doses   Did you bring the vaccination record card or other documentation?  yes   Do you have a health condition or are undergoing treatment that makes you moderately or severely immunocompromised? This would include, but not be limited to: cancer, HIV, organ transplant, immunosuppressive therapy/high-dose corticosteroids, or moderate/severe primary immunodeficiency.  No  Have you received COVID-19 vaccine before or during hematopoietic cell transplant (HCT) or CAR-T-cell therapies? No  Have you ever had an allergic reaction to: (This would include a severe allergic reaction or a reaction that caused hives, swelling, or respiratory distress, including wheezing.) A component of a COVID-19 vaccine or a previous dose of COVID-19 vaccine? No   Have you ever had an allergic reaction to another vaccine (other thanCOVID-19 vaccine) or an injectable medication? (This would include a severe allergic reaction or a reaction that caused hives, swelling, or respiratory distress, including wheezing.)   No    Do you have a history of any of the following:  Myocarditis or Pericarditis No  Dermal fillers:  No  Multisystem Inflammatory Syndrome (MIS-C or MIS-A)? No  COVID-19 disease within the past 3 months? No  Vaccinated with monkeypox vaccine in the last 4 weeks? No  Pfizer Comirnaty 2023-24 vaccine given and tolerated well. Stayed for 15 min observation without problem. Updated [facility] copy and covid card given. Jerel Shepherd, RN

## 2022-03-03 DIAGNOSIS — Z419 Encounter for procedure for purposes other than remedying health state, unspecified: Secondary | ICD-10-CM | POA: Diagnosis not present

## 2022-04-03 DIAGNOSIS — Z419 Encounter for procedure for purposes other than remedying health state, unspecified: Secondary | ICD-10-CM | POA: Diagnosis not present

## 2022-05-01 DIAGNOSIS — Z6837 Body mass index (BMI) 37.0-37.9, adult: Secondary | ICD-10-CM | POA: Diagnosis not present

## 2022-05-01 DIAGNOSIS — Z1389 Encounter for screening for other disorder: Secondary | ICD-10-CM | POA: Diagnosis not present

## 2022-05-01 DIAGNOSIS — F4321 Adjustment disorder with depressed mood: Secondary | ICD-10-CM | POA: Diagnosis not present

## 2022-05-01 DIAGNOSIS — B349 Viral infection, unspecified: Secondary | ICD-10-CM | POA: Diagnosis not present

## 2022-05-02 DIAGNOSIS — Z419 Encounter for procedure for purposes other than remedying health state, unspecified: Secondary | ICD-10-CM | POA: Diagnosis not present

## 2022-06-02 DIAGNOSIS — Z419 Encounter for procedure for purposes other than remedying health state, unspecified: Secondary | ICD-10-CM | POA: Diagnosis not present

## 2022-07-02 DIAGNOSIS — Z419 Encounter for procedure for purposes other than remedying health state, unspecified: Secondary | ICD-10-CM | POA: Diagnosis not present

## 2022-08-02 DIAGNOSIS — Z419 Encounter for procedure for purposes other than remedying health state, unspecified: Secondary | ICD-10-CM | POA: Diagnosis not present

## 2022-09-01 DIAGNOSIS — Z419 Encounter for procedure for purposes other than remedying health state, unspecified: Secondary | ICD-10-CM | POA: Diagnosis not present

## 2022-10-02 DIAGNOSIS — Z419 Encounter for procedure for purposes other than remedying health state, unspecified: Secondary | ICD-10-CM | POA: Diagnosis not present

## 2022-11-02 DIAGNOSIS — Z419 Encounter for procedure for purposes other than remedying health state, unspecified: Secondary | ICD-10-CM | POA: Diagnosis not present

## 2022-11-25 DIAGNOSIS — U071 COVID-19: Secondary | ICD-10-CM | POA: Diagnosis not present

## 2022-11-25 DIAGNOSIS — Z03818 Encounter for observation for suspected exposure to other biological agents ruled out: Secondary | ICD-10-CM | POA: Diagnosis not present

## 2022-12-02 DIAGNOSIS — Z419 Encounter for procedure for purposes other than remedying health state, unspecified: Secondary | ICD-10-CM | POA: Diagnosis not present

## 2022-12-31 ENCOUNTER — Encounter: Payer: Self-pay | Admitting: Nurse Practitioner

## 2022-12-31 ENCOUNTER — Ambulatory Visit: Payer: Medicaid Other | Admitting: Nurse Practitioner

## 2022-12-31 ENCOUNTER — Telehealth: Payer: Self-pay

## 2022-12-31 VITALS — BP 110/82 | HR 67 | Temp 97.9°F | Ht 62.75 in | Wt 198.0 lb

## 2022-12-31 DIAGNOSIS — Z1329 Encounter for screening for other suspected endocrine disorder: Secondary | ICD-10-CM

## 2022-12-31 DIAGNOSIS — L680 Hirsutism: Secondary | ICD-10-CM

## 2022-12-31 DIAGNOSIS — Z13220 Encounter for screening for lipoid disorders: Secondary | ICD-10-CM | POA: Diagnosis not present

## 2022-12-31 DIAGNOSIS — Z0189 Encounter for other specified special examinations: Secondary | ICD-10-CM

## 2022-12-31 DIAGNOSIS — N979 Female infertility, unspecified: Secondary | ICD-10-CM | POA: Diagnosis not present

## 2022-12-31 DIAGNOSIS — E669 Obesity, unspecified: Secondary | ICD-10-CM | POA: Diagnosis not present

## 2022-12-31 HISTORY — DX: Encounter for other specified special examinations: Z01.89

## 2022-12-31 LAB — CBC WITH DIFFERENTIAL/PLATELET
Basophils Absolute: 0 10*3/uL (ref 0.0–0.1)
Basophils Relative: 0.4 % (ref 0.0–3.0)
Eosinophils Absolute: 0 10*3/uL (ref 0.0–0.7)
Eosinophils Relative: 0.6 % (ref 0.0–5.0)
HCT: 42.4 % (ref 36.0–46.0)
Hemoglobin: 13.8 g/dL (ref 12.0–15.0)
Lymphocytes Relative: 23.9 % (ref 12.0–46.0)
Lymphs Abs: 1.3 10*3/uL (ref 0.7–4.0)
MCHC: 32.6 g/dL (ref 30.0–36.0)
MCV: 90.7 fL (ref 78.0–100.0)
Monocytes Absolute: 0.3 10*3/uL (ref 0.1–1.0)
Monocytes Relative: 6.2 % (ref 3.0–12.0)
Neutro Abs: 3.7 10*3/uL (ref 1.4–7.7)
Neutrophils Relative %: 68.9 % (ref 43.0–77.0)
Platelets: 312 10*3/uL (ref 150.0–400.0)
RBC: 4.67 Mil/uL (ref 3.87–5.11)
RDW: 13.2 % (ref 11.5–15.5)
WBC: 5.4 10*3/uL (ref 4.0–10.5)

## 2022-12-31 LAB — URINALYSIS, ROUTINE W REFLEX MICROSCOPIC
Bilirubin Urine: NEGATIVE
Ketones, ur: NEGATIVE
Nitrite: NEGATIVE
Specific Gravity, Urine: >=1.03 — AB (ref 1.000–1.030)
Total Protein, Urine: NEGATIVE
Urine Glucose: NEGATIVE
Urobilinogen, UA: 0.2 (ref 0.0–1.0)
pH: 6 (ref 5.0–8.0)

## 2022-12-31 LAB — COMPREHENSIVE METABOLIC PANEL
ALT: 12 U/L (ref 0–35)
AST: 14 U/L (ref 0–37)
Albumin: 3.9 g/dL (ref 3.5–5.2)
Alkaline Phosphatase: 72 U/L (ref 39–117)
BUN: 14 mg/dL (ref 6–23)
CO2: 30 meq/L (ref 19–32)
Calcium: 8.9 mg/dL (ref 8.4–10.5)
Chloride: 105 meq/L (ref 96–112)
Creatinine, Ser: 0.86 mg/dL (ref 0.40–1.20)
GFR: 89.87 mL/min (ref >60.00)
Glucose, Bld: 89 mg/dL (ref 70–99)
Potassium: 3.9 meq/L (ref 3.5–5.1)
Sodium: 141 meq/L (ref 135–145)
Total Bilirubin: 0.4 mg/dL (ref 0.2–1.2)
Total Protein: 6.8 g/dL (ref 6.0–8.3)

## 2022-12-31 LAB — LIPID PANEL
Cholesterol: 140 mg/dL (ref 0–200)
HDL: 49.3 mg/dL (ref >39.00)
LDL Cholesterol: 76 mg/dL (ref 0–99)
NonHDL: 91.13
Total CHOL/HDL Ratio: 3
Triglycerides: 77 mg/dL (ref 0.0–149.0)
VLDL: 15.4 mg/dL (ref 0.0–40.0)

## 2022-12-31 LAB — TSH: TSH: 1.88 u[IU]/mL (ref 0.35–5.50)

## 2022-12-31 LAB — HEMOGLOBIN A1C: Hgb A1c MFr Bld: 5.6 % (ref 4.6–6.5)

## 2022-12-31 MED ORDER — SPIRONOLACTONE 50 MG PO TABS: 50.0000 mg | ORAL_TABLET | Freq: Two times a day (BID) | ORAL | 11 refills | Status: DC

## 2022-12-31 NOTE — Assessment & Plan Note (Signed)
Will check A1c today. Encouraged healthy diet and exercise.  

## 2022-12-31 NOTE — Telephone Encounter (Signed)
LVM to CB.

## 2022-12-31 NOTE — Telephone Encounter (Signed)
Patient states she would like for [place] to please call her with her results when they are available.  Patient states she has been having trouble getting in MyChart.  I reviewed her user name with her and she thinks she may have been typing it in wrong.  Patient states she will try her MyChart again, but she would still like for [place] to call her with the results in case she is still unable to access her MyChart.

## 2022-12-31 NOTE — Assessment & Plan Note (Signed)
Advised patient that UTIs are not contagious or sexually transmitted. She denies any symptoms. Microscopic and culture pending. Will contact patient with results.

## 2022-12-31 NOTE — Progress Notes (Signed)
Bethanie Dicker, NP-C Phone: [phone]  Jaime Harvey is a 32 y.o. female who presents today to establish care and for urine check.  Patient does not have any UTI symptoms at this time. She is requesting to have her urine checked due to her husband recently having a UTI. She did have an over the counter urine test that was positive for leukocytes. Dysuria- No  Frequency- No   Urgency- No   Hematuria- No   Fever- No  Abd pain- No   Vaginal d/c- No  Hirsutism- Patient evaluated by Ob-Gyn in February 2023 for this issue. Reports increased facial hair growth, mostly on her chin. She was started on Spironolactone at that time. She reports not taking the medication for very long so she is unsure if it really helped or not. She is agreeable to going back on the medication to see if it will help.   Infertility- Followed by Ob-Gyn. She has tried Clomid and Letrozole without conceiving. She is having regular menstrual cycles. Reports Hx of ovarian cysts, concerns for PCOS contributing to her infertility. She was unable to tolerate Metformin in the past due to side effects. She is not currently tracking her menstrual cycles or ovulation.   Active Ambulatory Problems    Diagnosis Date Noted   H/O infertility 05/20/2018   Cyst of left ovary 05/20/2018   Gastroesophageal reflux disease with esophagitis without hemorrhage 10/18/2019   Hirsutism 12/31/2022   Encounter for urine test 12/31/2022   Obesity (BMI 30-39.9) 12/31/2022   Primary female infertility 12/31/2022   Resolved Ambulatory Problems    Diagnosis Date Noted   No Resolved Ambulatory Problems   Past Medical History:  Diagnosis Date   Allergy 12-30-22   Anxiety 12-30-22   Asthma    Depression 12-30-27   GERD (gastroesophageal reflux disease) 12-30-22   No known health problems     Family History  Problem Relation Age of Onset   Healthy Mother    Arthritis Mother    Diabetes Maternal Grandmother    Hypertension  Maternal Grandmother    Arthritis Maternal Grandmother    Miscarriages / Stillbirths Maternal Grandmother    Stroke Maternal Grandmother    Healthy Father    Hypotension Maternal Grandfather    Diabetes Maternal Grandfather    Arthritis Maternal Aunt     Social History   Socioeconomic History   Marital status: Married    Spouse name: Not on file   Number of children: Not on file   Years of education: Not on file   Highest education level: 12th grade  Occupational History   Not on file  Tobacco Use   Smoking status: Never   Smokeless tobacco: Never  Vaping Use   Vaping status: Never Used  Substance and Sexual Activity   Alcohol use: Not Currently   Drug use: No   Sexual activity: Yes    Birth control/protection: None  Other Topics Concern   Not on file  Social History Narrative   Not on file   Social Determinants of Health   Financial Resource Strain: Medium Risk (12/30/2022)   Overall Financial Resource Strain (CARDIA)    Difficulty of Paying Living Expenses: Somewhat hard  Food Insecurity: Food Insecurity Present (12/30/2022)   Hunger Vital Sign    Worried About Running Out of Food in the Last Year: Sometimes true    Ran Out of Food in the Last Year: Sometimes true  Transportation Needs: No Transportation Needs (12/30/2022)   PRAPARE - Transportation  Lack of Transportation (Medical): No    Lack of Transportation (Non-Medical): No  Physical Activity: Unknown (12/30/2022)   Exercise Vital Sign    Days of Exercise per Week: 0 days    Minutes of Exercise per Session: Not on file  Stress: No Stress Concern Present (12/30/2022)   [employer] of Occupational Health - Occupational Stress Questionnaire    Feeling of Stress : Only a little  Social Connections: Moderately Isolated (12/30/2022)   Social Connection and Isolation Panel [NHANES]    Frequency of Communication with Friends and Family: Twice a week    Frequency of Social Gatherings with Friends and  Family: Once a week    Attends Religious Services: Never    Database administrator or Organizations: No    Attends Engineer, structural: Not on file    Marital Status: Married  Catering manager Violence: Not on file    ROS  General:  Negative for unexplained weight loss, fever Skin: Negative for new or changing mole, sore that won't heal HEENT: Negative for trouble hearing, trouble seeing, ringing in ears, mouth sores, hoarseness, change in voice, dysphagia. CV:  Negative for chest pain, dyspnea, edema, palpitations Resp: Negative for cough, dyspnea, hemoptysis GI: Negative for nausea, vomiting, diarrhea, constipation, abdominal pain, melena, hematochezia. GU: Negative for dysuria, incontinence, urinary hesitance, hematuria, vaginal or penile discharge, polyuria, sexual difficulty, lumps in testicle or breasts MSK: Negative for muscle cramps or aches, joint pain or swelling Neuro: Negative for headaches, weakness, numbness, dizziness, passing out/fainting Psych: Negative for depression, anxiety, memory problems  Objective  Physical Exam Vitals:   12/31/22 0920  BP: 110/82  Pulse: 67  Temp: 97.9 F (36.6 C)  SpO2: 99%    BP Readings from Last 3 Encounters:  12/31/22 110/82  12/03/21 129/68  06/13/21 125/83   Wt Readings from Last 3 Encounters:  12/31/22 198 lb (89.8 kg)  12/02/21 210 lb (95.3 kg)  06/13/21 200 lb 3.2 oz (90.8 kg)    Physical Exam Constitutional:      General: She is not in acute distress.    Appearance: Normal appearance.  HENT:     Head: Normocephalic.  Cardiovascular:     Rate and Rhythm: Normal rate and regular rhythm.     Heart sounds: Normal heart sounds.  Pulmonary:     Effort: Pulmonary effort is normal.     Breath sounds: Normal breath sounds.  Skin:    General: Skin is warm and dry.  Neurological:     General: No focal deficit present.     Mental Status: She is alert.  Psychiatric:        Mood and Affect: Mood normal.         Behavior: Behavior normal.    Assessment/Plan:   Encounter for urine test Assessment & Plan: Advised patient that UTIs are not contagious or sexually transmitted. She denies any symptoms. Microscopic and culture pending. Will contact patient with results.   Orders: -     Urinalysis, Routine w reflex microscopic -     Urine Culture  Hirsutism Assessment & Plan: Chronic issue. Lab work as outlined. Will start patient back on Spironolactone 50 mg BID. Counseled on common side effects. She will return in one month for lab work.   Orders: -     CBC with Differential/Platelet -     Comprehensive metabolic panel -     Insulin, Free (Bioactive) -     Spironolactone; Take 1 tablet (50 mg total)  by mouth 2 (two) times daily.  Dispense: 60 tablet; Refill: 10  Primary female infertility Assessment & Plan: Managed by Ob-Gyn. Counseled on tracking menstrual cycles and ovulation. She is not interested in further work up at this time. She will follow up with Ob-Gyn if she decides to proceed with additional treatment/evaluation.    Obesity (BMI 30-39.9) Assessment & Plan: Will check A1c today. Encouraged healthy diet and exercise.   Orders: -     Hemoglobin A1c  Lipid screening -     Lipid panel  Thyroid disorder screen -     TSH    Return in about 6 months (around 07/01/2023) for Follow up.   Bethanie Dicker, NP-C [facility] - [employer]

## 2022-12-31 NOTE — Assessment & Plan Note (Addendum)
Managed by Ob-Gyn. Counseled on tracking menstrual cycles and ovulation. She is not interested in further work up at this time. She will follow up with Ob-Gyn if she decides to proceed with additional treatment/evaluation.

## 2022-12-31 NOTE — Assessment & Plan Note (Signed)
Chronic issue. Lab work as outlined. Will start patient back on Spironolactone 50 mg BID. Counseled on common side effects. She will return in one month for lab work.

## 2023-01-01 LAB — URINE CULTURE
MICRO NUMBER:: 15664041
Result:: NO GROWTH
SPECIMEN QUALITY:: ADEQUATE

## 2023-01-01 NOTE — Telephone Encounter (Signed)
Called pt to see if she needed me to give her a temp password, she stated she was able to get into her mychart as of last night, she stated that she did not see where her testosterone levels were checked she stated she mentioned them to you and she thought you would have them checked because she informed you that the obgyn said it was elevated

## 2023-01-02 DIAGNOSIS — Z419 Encounter for procedure for purposes other than remedying health state, unspecified: Secondary | ICD-10-CM | POA: Diagnosis not present

## 2023-01-08 NOTE — Telephone Encounter (Signed)
Called pt and read mychart msg and ifnormed her that once the provider has resulted the labs she will be informed

## 2023-01-08 NOTE — Telephone Encounter (Signed)
Patient states she would like for [place] to please call her to explain her lab results.  Patient states she doesn't understand everything she is seeing on MyChart.

## 2023-01-09 ENCOUNTER — Other Ambulatory Visit: Payer: Medicaid Other

## 2023-01-09 ENCOUNTER — Telehealth: Payer: Self-pay

## 2023-01-09 ENCOUNTER — Other Ambulatory Visit: Payer: Self-pay

## 2023-01-09 DIAGNOSIS — R319 Hematuria, unspecified: Secondary | ICD-10-CM | POA: Diagnosis not present

## 2023-01-09 NOTE — Telephone Encounter (Signed)
LVMTCB ibn regards to labs

## 2023-01-10 LAB — URINALYSIS, ROUTINE W REFLEX MICROSCOPIC
Bilirubin Urine: NEGATIVE
Glucose, UA: NEGATIVE
Hgb urine dipstick: NEGATIVE
Ketones, ur: NEGATIVE
Leukocytes,Ua: NEGATIVE
Nitrite: NEGATIVE
Protein, ur: NEGATIVE
Specific Gravity, Urine: 1.024 (ref 1.001–1.035)
pH: 5.5 (ref 5.0–8.0)

## 2023-01-18 LAB — INSULIN, FREE (BIOACTIVE): Insulin, Free: 13.5 u[IU]/mL (ref 1.5–14.9)

## 2023-02-01 DIAGNOSIS — Z419 Encounter for procedure for purposes other than remedying health state, unspecified: Secondary | ICD-10-CM | POA: Diagnosis not present

## 2023-03-04 DIAGNOSIS — Z419 Encounter for procedure for purposes other than remedying health state, unspecified: Secondary | ICD-10-CM | POA: Diagnosis not present

## 2023-04-03 ENCOUNTER — Ambulatory Visit: Payer: Self-pay | Admitting: Nurse Practitioner

## 2023-04-03 DIAGNOSIS — J209 Acute bronchitis, unspecified: Secondary | ICD-10-CM | POA: Diagnosis not present

## 2023-04-03 DIAGNOSIS — B9689 Other specified bacterial agents as the cause of diseases classified elsewhere: Secondary | ICD-10-CM | POA: Diagnosis not present

## 2023-04-03 DIAGNOSIS — B3731 Acute candidiasis of vulva and vagina: Secondary | ICD-10-CM | POA: Diagnosis not present

## 2023-04-03 DIAGNOSIS — J019 Acute sinusitis, unspecified: Secondary | ICD-10-CM | POA: Diagnosis not present

## 2023-04-03 DIAGNOSIS — B338 Other specified viral diseases: Secondary | ICD-10-CM | POA: Diagnosis not present

## 2023-04-03 DIAGNOSIS — Z03818 Encounter for observation for suspected exposure to other biological agents ruled out: Secondary | ICD-10-CM | POA: Diagnosis not present

## 2023-04-03 NOTE — Telephone Encounter (Signed)
Copied from CRM [id number]. Topic: Clinical - Red Word Triage >> Apr 03, 2023  7:35 AM Gurney Maxin H wrote: [facility] that prompted transfer to Nurse Triage: Sore throat, sinus pressue, body aches, sneezing runny nose  Chief Complaint: sinus pain Symptoms: Nasal& chest congestion, cough-productive-yellowish, sore throat, runny nose, body aches, warm to touch , sneezing, pressure around eyes, forehead & nose Frequency: Sunday Pertinent Negatives: Patient denies n/a Disposition: [] ED /[x] Urgent Care (no appt availability in office) / [] Appointment(In office/virtual)/ []  [facility]/ [] Home Care/ [] Refused Recommended Disposition /[] [facility]/ []  Follow-up with PCP Additional Notes: no in office appt available w/PCP of other providers within practice: offered pt to go to another practice to be seen or virtual pt refused.  Next recommended to go to Urgent care: pt stated she would go to walk in clinic down the street for home.  Reason for Disposition  [1] Sinus congestion (pressure, fullness) AND [2] present > 10 days  Answer Assessment - Initial Assessment Questions 1. LOCATION: "Where does it hurt?"      pressure around eyes, forehead & nose  2. ONSET: "When did the sinus pain start?"  (e.g., hours, days)      Sunday 3. SEVERITY: "How bad is the pain?"   (Scale 1-10; mild, moderate or severe)   - MILD (1-3): doesn't interfere with normal activities    - MODERATE (4-7): interferes with normal activities (e.g., work or school) or awakens from sleep   - SEVERE (8-10): excruciating pain and patient unable to do any normal activities        moderate 4. RECURRENT SYMPTOM: "Have you ever had sinus problems before?" If Yes, ask: "When was the last time?" and "What happened that time?"      Yes-and it was sinus related 5. NASAL CONGESTION: "Is the nose blocked?" If Yes, ask: "Can you open it or must you breathe through your mouth?"     yes 6. NASAL DISCHARGE: "Do you have  discharge from your nose?" If so ask, "What color?"     clear 7. FEVER: "Do you have a fever?" If Yes, ask: "What is it, how was it measured, and when did it start?"      Warm to touch 8. OTHER SYMPTOMS: "Do you have any other symptoms?" (e.g., sore throat, cough, earache, difficulty breathing)     Nasal& chest congestion, cough-productive-yellowish, sore throat, runny nose, body aches, warm to touch , sneezing, pressure around eyes, forehead & nose 9. PREGNANCY: "Is there any chance you are pregnant?" "When was your last menstrual period?"     N/a  Protocols used: Sinus Pain or Congestion-A-AH

## 2023-04-04 DIAGNOSIS — Z419 Encounter for procedure for purposes other than remedying health state, unspecified: Secondary | ICD-10-CM | POA: Diagnosis not present

## 2023-05-02 DIAGNOSIS — Z419 Encounter for procedure for purposes other than remedying health state, unspecified: Secondary | ICD-10-CM | POA: Diagnosis not present

## 2023-06-13 DIAGNOSIS — Z419 Encounter for procedure for purposes other than remedying health state, unspecified: Secondary | ICD-10-CM | POA: Diagnosis not present

## 2023-07-13 DIAGNOSIS — Z419 Encounter for procedure for purposes other than remedying health state, unspecified: Secondary | ICD-10-CM | POA: Diagnosis not present

## 2023-07-31 ENCOUNTER — Ambulatory Visit (INDEPENDENT_AMBULATORY_CARE_PROVIDER_SITE_OTHER): Admitting: Nurse Practitioner

## 2023-07-31 VITALS — BP 110/84 | HR 73 | Temp 98.3°F | Ht 62.75 in | Wt 194.1 lb

## 2023-07-31 DIAGNOSIS — L237 Allergic contact dermatitis due to plants, except food: Secondary | ICD-10-CM | POA: Diagnosis not present

## 2023-07-31 DIAGNOSIS — L680 Hirsutism: Secondary | ICD-10-CM

## 2023-07-31 MED ORDER — METHYLPREDNISOLONE 4 MG PO TBPK: ORAL_TABLET | ORAL | 0 refills | Status: DC

## 2023-07-31 NOTE — Progress Notes (Signed)
 Bluford Burkitt, NP-C Phone: [phone]  Jaime Harvey is a 33 y.o. female who presents today for poison ivy.  Discussed the use of AI scribe software for clinical note transcription with the patient, who gave verbal consent to proceed.  History of Present Illness   Jaime Harvey is a 33 year old female who presents with a rash suspected to be poison ivy.  She developed a rash after working in her yard, her husband suggested it might be poison ivy. The rash is located on her left hand and left leg and is intensely pruritic. She has been using an over-the-counter cream to alleviate the itching, but it does not seem to be healing the rash, which appears to be worsening. There is no spreading of the rash beyond the initial areas.  She has a history of skin tags and warts, which have been present for a long time. She recalls having them treated with cryotherapy in the past but has not pursued recent treatment. The skin tags and warts are not currently causing significant issues.  She has a history of polycystic ovary syndrome (PCOS) and had been on spironolactone , which she discontinued due to gastrointestinal side effects, including diarrhea, exacerbated by her lactose intolerance. She has not been taking any medication for PCOS recently.    Social History   Tobacco Use  Smoking Status Never  Smokeless Tobacco Never    Current Outpatient Medications on File Prior to Visit  Medication Sig Dispense Refill   ibuprofen  (ADVIL ) 800 MG tablet Take 1 tablet (800 mg total) by mouth every 8 (eight) hours as needed. 60 tablet 1   No current facility-administered medications on file prior to visit.    ROS see history of present illness  Objective  Physical Exam Vitals:   07/31/23 1344  BP: 110/84  Pulse: 73  Temp: 98.3 F (36.8 C)  SpO2: 98%    BP Readings from Last 3 Encounters:  07/31/23 110/84  12/31/22 110/82  12/03/21 129/68   Wt Readings from Last 3 Encounters:   07/31/23 194 lb 1.9 oz (88.1 kg)  12/31/22 198 lb (89.8 kg)  12/02/21 210 lb (95.3 kg)    Physical Exam Constitutional:      General: She is not in acute distress.    Appearance: Normal appearance.  HENT:     Head: Normocephalic.  Cardiovascular:     Rate and Rhythm: Normal rate and regular rhythm.     Heart sounds: Normal heart sounds.  Pulmonary:     Effort: Pulmonary effort is normal.     Breath sounds: Normal breath sounds.  Skin:    General: Skin is warm and dry.     Findings: Rash present. Rash is pustular.     Comments: Linear pustules noted on left hand and leg consistent with poison ivy  Neurological:     General: No focal deficit present.     Mental Status: She is alert.  Psychiatric:        Mood and Affect: Mood normal.        Behavior: Behavior normal.      Assessment/Plan: Please see individual problem list.  Poison ivy dermatitis Assessment & Plan: Localized dermatitis on the left hand and leg with itching. No significant spreading observed. Oral prednisone prescribed with a tapering dose after discussing side effects. Recommend Benadryl  at bedtime for itching and to aid sleep. Continue applying calamine lotion as needed. Return precautions given to patient.   Orders: -  methylPREDNISolone ; Take as directed.  Dispense: 21 each; Refill: 0  Hirsutism Assessment & Plan: Chronic. Hx of PCOS and infertility. No longer taking Spironolactone  due to adverse side effects. She is not interested in further medication or work up for this. She will follow up with Ob-Gyn if she decides to proceed with additional treatment/evaluation.      Return if symptoms worsen or fail to improve.   Bluford Burkitt, NP-C [facility] - [facility]

## 2023-08-05 ENCOUNTER — Encounter: Payer: Self-pay | Admitting: Nurse Practitioner

## 2023-08-05 DIAGNOSIS — L237 Allergic contact dermatitis due to plants, except food: Secondary | ICD-10-CM | POA: Insufficient documentation

## 2023-08-05 NOTE — Assessment & Plan Note (Signed)
 Localized dermatitis on the left hand and leg with itching. No significant spreading observed. Oral prednisone prescribed with a tapering dose after discussing side effects. Recommend Benadryl  at bedtime for itching and to aid sleep. Continue applying calamine lotion as needed. Return precautions given to patient.

## 2023-08-05 NOTE — Assessment & Plan Note (Signed)
 Chronic. Hx of PCOS and infertility. No longer taking Spironolactone  due to adverse side effects. She is not interested in further medication or work up for this. She will follow up with Ob-Gyn if she decides to proceed with additional treatment/evaluation.

## 2023-08-13 DIAGNOSIS — Z419 Encounter for procedure for purposes other than remedying health state, unspecified: Secondary | ICD-10-CM | POA: Diagnosis not present

## 2023-09-12 DIAGNOSIS — Z419 Encounter for procedure for purposes other than remedying health state, unspecified: Secondary | ICD-10-CM | POA: Diagnosis not present

## 2023-10-13 DIAGNOSIS — Z419 Encounter for procedure for purposes other than remedying health state, unspecified: Secondary | ICD-10-CM | POA: Diagnosis not present

## 2023-11-13 DIAGNOSIS — Z419 Encounter for procedure for purposes other than remedying health state, unspecified: Secondary | ICD-10-CM | POA: Diagnosis not present

## 2023-12-22 ENCOUNTER — Ambulatory Visit: Payer: Self-pay

## 2023-12-22 NOTE — Telephone Encounter (Signed)
 FYI Only or Action Required?: Action required by provider: request for appointment.  Patient was last seen in primary care on 07/31/2023 by Gretel App, NP.  Called Nurse Triage reporting Sore Throat.  Symptoms began yesterday.  Interventions attempted: Nothing.  Symptoms are: gradually worsening.  Triage Disposition: See PCP When Office is Open (Within 3 Days)  Patient/caregiver understands and will follow disposition?: Yes   Copied from CRM #[id number]. Topic: Clinical - Red Word Triage >> Dec 22, 2023  9:10 AM Eva FALCON wrote: Red Word that prompted transfer to Nurse Triage: sore throat, white spots on throat, felt like a hair in her throat, sore when swallowing. Reason for Disposition  [1] Sore throat is the only symptom AND [2] present > 48 hours  Answer Assessment - Initial Assessment Questions 1. ONSET: When did the throat start hurting? (Hours or days ago)      Last Night  2. SEVERITY: How bad is the sore throat? (Scale 1-10; mild, moderate or severe)     Mild to Moderate  3. STREP EXPOSURE: Has there been any exposure to strep within the past week? If Yes, ask: What type of contact occurred?      Denies  4.  VIRAL SYMPTOMS: Are there any symptoms of a cold, such as a runny nose, cough, hoarse voice or red eyes?      No  5. FEVER: Do you have a fever? If Yes, ask: What is your temperature, how was it measured, and when did it start?     No  6. PUS ON THE TONSILS: Is there pus on the tonsils in the back of your throat?     White Spots   7. OTHER SYMPTOMS: Do you have any other symptoms? (e.g., difficulty breathing, headache, rash)     Aching, Fatigue  8. PREGNANCY: Is there any chance you are pregnant? When was your last menstrual period?     No  Protocols used: Sore Throat-A-AH

## 2023-12-23 ENCOUNTER — Ambulatory Visit

## 2023-12-23 ENCOUNTER — Other Ambulatory Visit (HOSPITAL_COMMUNITY): Admission: RE | Admit: 2023-12-23 | Discharge: 2023-12-23 | Disposition: A | Discharge status: Home / Self Care

## 2023-12-23 VITALS — BP 95/71 | HR 84 | Resp 16 | Ht 63.0 in | Wt 214.0 lb

## 2023-12-23 DIAGNOSIS — J029 Acute pharyngitis, unspecified: Secondary | ICD-10-CM | POA: Insufficient documentation

## 2023-12-23 LAB — POCT RAPID STREP A (OFFICE): Rapid Strep A Screen: NEGATIVE

## 2023-12-23 MED ORDER — LIDOCAINE VISCOUS HCL 2 % MT SOLN: 15.0000 mL | OROMUCOSAL | 0 refills | Status: AC | PRN

## 2023-12-23 NOTE — Patient Instructions (Signed)
 I recommend Mucinex, tylenol  as needed for throat pain, and lidocaine  mouth wash as needed for pain.

## 2023-12-23 NOTE — Progress Notes (Signed)
 Acute visit  Patient: Jaime Harvey   DOB: August 08, 1990   33 y.o. Female  MRN: [id number] PCP: Gretel App, NP   Chief Complaint  Patient presents with   Acute Visit    Sore Throat ( Monday night)  Swollen tonsils    Subjective    Discussed the use of AI scribe software for clinical note transcription with the patient, who gave verbal consent to proceed.  History of Present Illness Jaime Harvey is a 33 year old female who presents with a sore throat and possible tonsil stones.  She began experiencing a sore throat on Monday night, accompanied by achiness. She attempted to alleviate symptoms with salt water gargles and warm showers. By the next day, the sore throat persisted on one side but began to improve slightly by the following night and morning. She has been using cough drops, which have been helpful.  She notes that her tonsil on one side is more swollen than the other and has observed a 'little white ball' at the back of her throat, which she describes as feeling like 'a hair stuck in your throat.' Her coworkers suggested it might be a tonsil stone. She has never had issues with her tonsils before.  She experienced chills on Monday night and possibly a slight fever, though she did not measure her temperature. No current sore throat, nausea, vomiting, or diarrhea. She feels slightly congested.  She has not tested for COVID-19 at home, although she has had COVID-19 twice in the past. She frequently experiences sore throats.  For treatment, she primarily uses salt water gargles and cough drops. She occasionally takes ibuprofen  for pain relief.  She works six days a week and has limited contact with young children, although she has a three-year-old nephew and a thirteen-year-old brother.   Review of systems as noted in HPI.   Objective    BP 95/71 (BP Location: Right Arm, Patient Position: Sitting)   Pulse 84   Resp 16   Ht 5' 3 (1.6 m)   Wt 214 lb (97.1  kg)   SpO2 100%   BMI 37.91 kg/m  Physical Exam Constitutional:      Appearance: Normal appearance.  HENT:     Head: Normocephalic and atraumatic.     Mouth/Throat:     Mouth: Mucous membranes are moist.     Comments: Several small white lesions noted on tonsils.  Eyes:     Pupils: Pupils are equal, round, and reactive to light.  Cardiovascular:     Rate and Rhythm: Normal rate and regular rhythm.     Heart sounds: Normal heart sounds.  Pulmonary:     Effort: Pulmonary effort is normal.     Breath sounds: Normal breath sounds.  Skin:    General: Skin is warm.  Neurological:     General: No focal deficit present.     Mental Status: She is alert.       Results for orders placed or performed in visit on 12/23/23  POCT rapid strep A  Result Value Ref Range   Rapid Strep A Screen Negative Negative    Assessment & Plan     Problem List Items Addressed This Visit   None Visit Diagnoses       Sore throat    -  Primary   Relevant Medications   lidocaine  (XYLOCAINE ) 2 % solution   Other Relevant Orders   Culture, Group A Strep   POCT rapid strep A (  Completed)      Assessment & Plan Acute viral pharyngitis with tonsil stones Acute viral pharyngitis with tonsil stones, negative rapid strep test. Symptoms improving but still having persistent sore throat. Most likely viral etiology, but will rule out strep infection. - Perform throat culture to rule out bacterial infection. - Recommend salt water gargles for symptomatic relief. - Prescribe lidocaine  mouthwash for throat pain. - Recommend acetaminophen  or ibuprofen  as needed for pain. - Instruct to return if pain worsens.     Meds ordered this encounter  Medications   lidocaine  (XYLOCAINE ) 2 % solution    Sig: Use as directed 15 mLs in the mouth or throat as needed for mouth pain.    Dispense:  100 mL    Refill:  0     No follow-ups on file.      Isaiah DELENA Pepper, MD  [facility] [phone] (phone) [phone] (fax)

## 2023-12-24 ENCOUNTER — Telehealth: Payer: Self-pay

## 2023-12-24 ENCOUNTER — Other Ambulatory Visit: Payer: Self-pay

## 2023-12-24 NOTE — Telephone Encounter (Signed)
 Copied from CRM [id number]. Topic: Clinical - Request for Lab/Test Order >> Dec 24, 2023  8:33 AM Logan F wrote: Reason for CRM: Sheena from Microbiology form [place] says they recieved a swab for pt but there is not an order in system. Please place order for Group A strep Culture. If need to cal call [phone]

## 2023-12-24 NOTE — Telephone Encounter (Signed)
 FYI......SABRACalled and spoke with Olam regarding specimen, this was supposed to be sent to [facility] and not microbiology for the results of Group A strep culture.  Somehow it got sent to [facility] rather than [facility] in which the order was supposed to be sent to.  Advised by Olam to call [facility] at [facility] to see if they would be able to process the sample as it would be over 24 hours. Spoke with Charlie @ [facility] and he stated that it would be processed.

## 2023-12-24 NOTE — Telephone Encounter (Signed)
 Copied from CRM [id number]. Topic: Clinical - Request for Lab/Test Order >> Dec 24, 2023  8:33 AM Logan F wrote: Reason for CRM: Sheena from Microbiology form  says they recieved a swab for pt but there is not an order in system. Please place order for Group A strep Culture. If need to cal call [phone] >> Dec 24, 2023  3:35 PM Tiffini S wrote: Call from [facility] with [facility]- need an update for order request this morning. Have a swab and need to place order for Group A strep Culture. Called [facility], spoke with Suzen, said the provider and the nurse are both out today. No one available to submit order. Please  call [phone]- per Olam, states that there is a 24 hour turnaround for the swab and then it cannot be tested. She will review the policy and call back.

## 2023-12-24 NOTE — Telephone Encounter (Signed)
 Order is in, please call lab to let them know

## 2023-12-24 NOTE — Telephone Encounter (Signed)
 Hey, there is an order already in. Is it the wrong order?

## 2023-12-25 DIAGNOSIS — J029 Acute pharyngitis, unspecified: Secondary | ICD-10-CM | POA: Diagnosis not present

## 2023-12-25 NOTE — Telephone Encounter (Signed)
 Spoke with Dr. Franchot when the specimen was returned.  Dr. Franchot replaced the order and it was placed in the collection bin to be picked up within the coming hour, as, when requested, the courier stated he would return to pick it up.

## 2023-12-25 NOTE — Addendum Note (Signed)
 Addended by: FRANCHOT RAKE A on: 12/25/2023 12:19 PM   Modules accepted: Orders

## 2023-12-30 LAB — CULTURE, GROUP A STREP (THRC)
